# Patient Record
Sex: Female | Born: 1980 | Hispanic: No | Marital: Single | Smoking: Never smoker
Health system: Southern US, Community
[De-identification: ages and names within clinical notes are randomized; demographics above are authoritative.]

## PROBLEM LIST (undated history)

## (undated) DIAGNOSIS — F419 Anxiety disorder, unspecified: Secondary | ICD-10-CM

## (undated) DIAGNOSIS — N200 Calculus of kidney: Secondary | ICD-10-CM

## (undated) DIAGNOSIS — E7800 Pure hypercholesterolemia, unspecified: Secondary | ICD-10-CM

## (undated) DIAGNOSIS — R87619 Unspecified abnormal cytological findings in specimens from cervix uteri: Secondary | ICD-10-CM

## (undated) HISTORY — PX: OOPHORECTOMY: SHX86

## (undated) HISTORY — PX: COLPOSCOPY: SHX161

## (undated) HISTORY — DX: Unspecified abnormal cytological findings in specimens from cervix uteri: R87.619

## (undated) HISTORY — DX: Calculus of kidney: N20.0

## (undated) HISTORY — PX: OVARY REMOVAL: SHX86

## (undated) HISTORY — DX: Anxiety disorder, unspecified: F41.9

---

## 2001-12-08 ENCOUNTER — Other Ambulatory Visit: Admission: RE | Admit: 2001-12-08 | Discharge: 2001-12-08 | Payer: Self-pay | Admitting: *Deleted

## 2003-06-16 ENCOUNTER — Other Ambulatory Visit: Admission: RE | Admit: 2003-06-16 | Discharge: 2003-06-16 | Payer: Self-pay | Admitting: Gynecology

## 2005-03-13 ENCOUNTER — Other Ambulatory Visit: Admission: RE | Admit: 2005-03-13 | Discharge: 2005-03-13 | Payer: Self-pay | Admitting: Obstetrics and Gynecology

## 2006-06-05 ENCOUNTER — Other Ambulatory Visit: Admission: RE | Admit: 2006-06-05 | Discharge: 2006-06-05 | Payer: Self-pay | Admitting: Obstetrics and Gynecology

## 2006-10-13 ENCOUNTER — Other Ambulatory Visit: Admission: RE | Admit: 2006-10-13 | Discharge: 2006-10-13 | Payer: Self-pay | Admitting: Obstetrics and Gynecology

## 2007-02-17 ENCOUNTER — Other Ambulatory Visit: Admission: RE | Admit: 2007-02-17 | Discharge: 2007-02-17 | Payer: Self-pay | Admitting: Obstetrics and Gynecology

## 2007-07-06 ENCOUNTER — Other Ambulatory Visit: Admission: RE | Admit: 2007-07-06 | Discharge: 2007-07-06 | Payer: Self-pay | Admitting: Obstetrics and Gynecology

## 2007-12-21 ENCOUNTER — Other Ambulatory Visit: Admission: RE | Admit: 2007-12-21 | Discharge: 2007-12-21 | Payer: Self-pay | Admitting: Obstetrics and Gynecology

## 2012-03-24 ENCOUNTER — Ambulatory Visit (INDEPENDENT_AMBULATORY_CARE_PROVIDER_SITE_OTHER): Payer: BC Managed Care – PPO | Admitting: Licensed Clinical Social Worker

## 2012-03-24 DIAGNOSIS — F411 Generalized anxiety disorder: Secondary | ICD-10-CM

## 2012-03-25 ENCOUNTER — Telehealth: Payer: Self-pay | Admitting: Gynecology

## 2012-03-26 ENCOUNTER — Telehealth: Payer: Self-pay | Admitting: Certified Nurse Midwife

## 2012-03-29 ENCOUNTER — Ambulatory Visit (INDEPENDENT_AMBULATORY_CARE_PROVIDER_SITE_OTHER): Payer: BC Managed Care – PPO | Admitting: Certified Nurse Midwife

## 2012-03-29 ENCOUNTER — Other Ambulatory Visit: Payer: Self-pay | Admitting: *Deleted

## 2012-03-29 ENCOUNTER — Encounter: Payer: Self-pay | Admitting: Certified Nurse Midwife

## 2012-03-29 VITALS — BP 104/64

## 2012-03-29 DIAGNOSIS — F411 Generalized anxiety disorder: Secondary | ICD-10-CM

## 2012-03-29 DIAGNOSIS — N852 Hypertrophy of uterus: Secondary | ICD-10-CM

## 2012-03-29 MED ORDER — ESCITALOPRAM OXALATE 10 MG PO TABS: 10.0000 mg | ORAL_TABLET | Freq: Every day | ORAL | Status: DC

## 2012-03-29 NOTE — Patient Instructions (Addendum)

## 2012-03-29 NOTE — Telephone Encounter (Signed)
Left message with J.Whitt to call on 03/25/12

## 2012-03-29 NOTE — Telephone Encounter (Signed)
Spoke with Lauren Moran this am @ 8:30 am  Patient had visit with her and [name] feels as I did medication a good option for her. Patient needs appointment with me to start medication. DLeonard CNM

## 2012-03-29 NOTE — Progress Notes (Addendum)
32 yo swf gopo here to discuss medication use for anxiety as discussed at annual exam on 03-15-12.  Continues to have periods of panic attacks with any social situation. 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  Family history of anxiety/depression with mother and sister. No thoughts of self harm or others.  So tired of feeling afraid and anxious.  Patient seen by [name] counselor as suggested at annual exam on 03-25-12.  Patient had not realized she was  Self medicating  with wine every night to get through the evening and the next day.  Felt the counseling evaluation was great.  Has another appointment on 04-08-12.   Patient has read on medication use, understands risks, benefits profile and would like to try.  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  Discussed risk of alcohol use with prescription drugs.  If thought of self harm or others seek emergency help or 911.  Patient agreeable RX Lexapro 10 mg po every am  #30 no refills    Continue counseling as scheduled 2-Keep evaluation appointment 3-Keep follow up appointment   RV 1 1/2 week, prn  35  minutes spent with patient with >50% of time spent in face to face counseling. Reviewed, TL

## 2012-03-31 ENCOUNTER — Ambulatory Visit (INDEPENDENT_AMBULATORY_CARE_PROVIDER_SITE_OTHER): Payer: BC Managed Care – PPO

## 2012-03-31 ENCOUNTER — Ambulatory Visit (INDEPENDENT_AMBULATORY_CARE_PROVIDER_SITE_OTHER): Payer: BC Managed Care – PPO | Admitting: Obstetrics and Gynecology

## 2012-03-31 ENCOUNTER — Other Ambulatory Visit: Payer: Self-pay | Admitting: Obstetrics and Gynecology

## 2012-03-31 ENCOUNTER — Other Ambulatory Visit: Payer: Self-pay | Admitting: Certified Nurse Midwife

## 2012-03-31 DIAGNOSIS — R1900 Intra-abdominal and pelvic swelling, mass and lump, unspecified site: Secondary | ICD-10-CM

## 2012-03-31 DIAGNOSIS — N852 Hypertrophy of uterus: Secondary | ICD-10-CM

## 2012-03-31 DIAGNOSIS — R1903 Right lower quadrant abdominal swelling, mass and lump: Secondary | ICD-10-CM

## 2012-03-31 DIAGNOSIS — F411 Generalized anxiety disorder: Secondary | ICD-10-CM | POA: Insufficient documentation

## 2012-03-31 NOTE — Patient Instructions (Signed)
Call back with date you would like to request for surgery.

## 2012-03-31 NOTE — Telephone Encounter (Signed)
Patient was given rx for trisprintec at annual exam for 1 yr on 03/15/12

## 2012-03-31 NOTE — Progress Notes (Signed)
32 yo single white female G0P0 who was seen for anex 03/15/2012 by Leota Sauers and was noted to have an enlarged uterus.  Pt presents today for PUS.  PUS shows a normal sized uterus 7 x 4 x 3 cm and a normal left ovary.  The right ovary is enlarged  With a 9 x 7 x 5 cm mass  with thick septations and some solid areas.  No suspicious doppler flow.  No free fluid.  Ipsilateral kidney seen and is w/o hydronephrosis.    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  These findings were discussed with the patient.  I recommend surgical removal of the mass, probably with USO however if I see there is viable ovary on that side, I will do a cystectomy.  Plan would be for laparoscopic RSO.  Procedure discussed with patient including outpt basis, and estimated time out of work of two weeks.  Pt requests surgery the end of April to allow for some issues at work to be resolved.  We will precede with scheduling, and then she will need a pre-op consult soon before surgery.   Her questions were invited and answered.

## 2012-04-07 ENCOUNTER — Ambulatory Visit (INDEPENDENT_AMBULATORY_CARE_PROVIDER_SITE_OTHER): Payer: BC Managed Care – PPO | Admitting: Licensed Clinical Social Worker

## 2012-04-07 DIAGNOSIS — F411 Generalized anxiety disorder: Secondary | ICD-10-CM

## 2012-04-09 ENCOUNTER — Ambulatory Visit (INDEPENDENT_AMBULATORY_CARE_PROVIDER_SITE_OTHER): Payer: BC Managed Care – PPO | Admitting: Certified Nurse Midwife

## 2012-04-09 ENCOUNTER — Encounter: Payer: Self-pay | Admitting: Certified Nurse Midwife

## 2012-04-09 VITALS — BP 100/64

## 2012-04-09 DIAGNOSIS — F411 Generalized anxiety disorder: Secondary | ICD-10-CM

## 2012-04-09 NOTE — Progress Notes (Signed)
31 y.o.Single Caucasian femaleG0P0000 here for follow-up of anxiety disorder being treated with  Lexapro   Initiated March 25,2014.  Patient taking medication as instructed in am.  Denies nausea, headache or other medication side effects. Reports no crying, one panic attacks no insomnia,  no fatigue, no thoughts of self harm or others.  Feelings of anxiety decreased somewhat.  Seeing [name]   Counselor weekly  Next visit April 28, 2012. Feels less anxious in general.  Has decreased alcohol use to only 2 drinks since starting Lexapro.  Diagnosed with ovarian cyst with surgery scheduled, not anxious about the surgery. Resting well when not at work, not worrying about the next day. Desires continuation.  O:Healthy WD,WN female, appropriately dressed    : Affect : Appropriate smiling A:1-Anxiety responding to Lexapro  2-Counseling in progress  3-Recent diagnosis of ovarian cyst with surgery scheduled  P:1- Continue medication as prescribed 2-Has Rx 3-RV 2 weeks  4-Instructed if thoughts of self harm or others seek immediate help 911 or emergency room.  Questions addressed.     35 minutes spent with patient with >50% of time spent in face to face counseling.                 Reviewed, TL

## 2012-04-12 ENCOUNTER — Encounter (HOSPITAL_COMMUNITY): Payer: Self-pay

## 2012-04-15 ENCOUNTER — Encounter (HOSPITAL_COMMUNITY): Payer: Self-pay

## 2012-04-15 ENCOUNTER — Encounter (HOSPITAL_COMMUNITY)
Admission: RE | Admit: 2012-04-15 | Discharge: 2012-04-15 | Disposition: A | Discharge status: Home / Self Care | Payer: BC Managed Care – PPO | Source: Ambulatory Visit | Attending: Obstetrics and Gynecology | Admitting: Obstetrics and Gynecology

## 2012-04-15 HISTORY — DX: Anxiety disorder, unspecified: F41.9

## 2012-04-15 HISTORY — DX: Pure hypercholesterolemia, unspecified: E78.00

## 2012-04-15 LAB — CBC
HCT: 39 % (ref 36.0–46.0)
MCH: 30.4 pg (ref 26.0–34.0)
MCHC: 33.3 g/dL (ref 30.0–36.0)
MCV: 91.1 fL (ref 78.0–100.0)
Platelets: 344 10*3/uL (ref 150–400)
RDW: 12.3 % (ref 11.5–15.5)

## 2012-04-15 NOTE — Patient Instructions (Addendum)
   Your procedure is scheduled RU:[id number] April 15  Enter through the Main Entrance of [facility] at:9:30am Pick up the phone at the desk and dial [phone] and inform us of your arrival.  Please call this number if you have any problems the morning of surgery: [phone]  Remember: Do not eat food or drink anything after midnight on Monday  Please take your Lexapro morning of surgery with sips of water  Do not wear jewelry, make-up, or FINGER nail polish No metal in your hair or on your body. Do not wear lotions, powders, perfumes. You may wear deodorant.  Please use your CHG wash as directed prior to surgery.  Do not shave anywhere for at least 12 hours prior to first CHG shower.  Do not bring valuables to the hospital. Contacts, dentures or bridgework may not be worn into surgery.  Leave suitcase in the car. After Surgery it may be brought to your room. For patients being admitted to the hospital, checkout time is 11:00am the day of discharge.  Patients discharged on the day of surgery will not be allowed to drive home.

## 2012-04-19 NOTE — H&P (Signed)
32 y.o.  Single  white female   G0P0000 here for laparoscopic RSO for ovarian cyst.  Pt was noted to have a pelvic mass on routine exam in March, and had a PUS which revealed a nl uterus and left ovary, with a 9 cm solid/cystic mass on the right ovary, most c/w dermoid or cystadenoma.      Patient's last menstrual period was 03/23/2012.          Sexually active: yes  The current method of family planning is OCP (estrogen/progesterone).    Exercising:  Last mammogram:   Last pap smear: History of abnormal pap:  Smoking: Alcohol: Last colonoscopy: Last Bone Density:   Last tetanus shot: Last cholesterol check:   Hgb:                Urine:    Health Maintenance  Topic Date Due  . Pap Smear  08/14/1999  . Tetanus/tdap  08/13/2000  . Influenza Vaccine  09/06/2012    Family History  Problem Relation Age of Onset  . Anxiety disorder Mother   . Anxiety disorder Sister     Patient Active Problem List  Diagnosis  . Anxiety state, unspecified    Past Medical History  Diagnosis Date  . Anxiety   . Hypercholesteremia     Past Surgical History  Procedure Laterality Date  . No past surgeries      Allergies: Review of patient's allergies indicates no known allergies.  No current facility-administered medications for this encounter.   Current Outpatient Prescriptions  Medication Sig Dispense Refill  . escitalopram (LEXAPRO) 10 MG tablet Take 10 mg by mouth daily.      Lorita Officer Triphasic (TRI-SPRINTEC PO) Take 1 tablet by mouth daily.         ROS: Pertinent items are noted in HPI.  Social Hx:  Single, no children  Exam:    Ht 5' 5.5" (1.664 m)  Wt 140 lb (63.504 kg)  BMI 22.93 kg/m2  LMP 03/23/2012   Wt Readings from Last 3 Encounters:  04/15/12 137 lb (62.143 kg)  04/14/12 140 lb (63.504 kg)     Ht Readings from Last 3 Encounters:  04/15/12 5' 5.5" (1.664 m)  04/14/12 5' 5.5" (1.664 m)    General appearance: alert, cooperative and appears  stated age Head: Normocephalic, without obvious abnormality, atraumatic Neck: no adenopathy, supple, symmetrical, trachea midline and thyroid not enlarged, symmetric, no tenderness/mass/nodules Lungs: clear to auscultation bilaterally Breasts: Inspection negative, No nipple retraction or dimpling, No nipple discharge or bleeding, No axillary or supraclavicular adenopathy, Normal to palpation without dominant masses Heart: regular rate and rhythm Abdomen: soft, non-tender; bowel sounds normal; no masses,  no organomegaly Extremities: extremities normal, atraumatic, no cyanosis or edema Skin: Skin color, texture, turgor normal. No rashes or lesions Lymph nodes: Cervical, supraclavicular, and axillary nodes normal. No abnormal inguinal nodes palpated Neurologic: Grossly normal   Pelvic: External genitalia:  no lesions              Urethra:  normal appearing urethra with no masses, tenderness or lesions              Bartholins and Skenes: normal                 Vagina: normal appearing vagina with normal color and discharge, no lesions              Cervix: normal appearance  Bimanual Exam:  Uterus:  uterus is normal size, shape, consistency and nontender                                      Adnexa: left nl.  Right with 9 cm mass, nt                                      Anus:  normal sphincter tone, no lesions  A: right ovarian mass, prob dermoid vs cystadenoma     P: Laparoscopic RSO

## 2012-04-20 ENCOUNTER — Ambulatory Visit (HOSPITAL_COMMUNITY): Payer: BC Managed Care – PPO | Admitting: Anesthesiology

## 2012-04-20 ENCOUNTER — Encounter (HOSPITAL_COMMUNITY)
Admission: RE | Disposition: A | Discharge status: Home / Self Care | Payer: Self-pay | Source: Ambulatory Visit | Attending: Obstetrics and Gynecology

## 2012-04-20 ENCOUNTER — Encounter (HOSPITAL_COMMUNITY): Payer: Self-pay | Admitting: Anesthesiology

## 2012-04-20 ENCOUNTER — Encounter: Payer: Self-pay | Admitting: Obstetrics and Gynecology

## 2012-04-20 ENCOUNTER — Ambulatory Visit (INDEPENDENT_AMBULATORY_CARE_PROVIDER_SITE_OTHER): Payer: BC Managed Care – PPO | Admitting: Obstetrics and Gynecology

## 2012-04-20 ENCOUNTER — Ambulatory Visit (HOSPITAL_COMMUNITY)
Admission: RE | Admit: 2012-04-20 | Discharge: 2012-04-20 | Disposition: A | Discharge status: Home / Self Care | Payer: BC Managed Care – PPO | Source: Ambulatory Visit | Attending: Obstetrics and Gynecology | Admitting: Obstetrics and Gynecology

## 2012-04-20 VITALS — BP 118/76 | Wt 139.0 lb

## 2012-04-20 DIAGNOSIS — N83209 Unspecified ovarian cyst, unspecified side: Secondary | ICD-10-CM

## 2012-04-20 DIAGNOSIS — N839 Noninflammatory disorder of ovary, fallopian tube and broad ligament, unspecified: Secondary | ICD-10-CM | POA: Insufficient documentation

## 2012-04-20 DIAGNOSIS — D279 Benign neoplasm of unspecified ovary: Secondary | ICD-10-CM | POA: Insufficient documentation

## 2012-04-20 DIAGNOSIS — N83201 Unspecified ovarian cyst, right side: Secondary | ICD-10-CM

## 2012-04-20 HISTORY — PX: LAPAROSCOPY: SHX197

## 2012-04-20 HISTORY — PX: SALPINGOOPHORECTOMY: SHX82

## 2012-04-20 SURGERY — LAPAROSCOPY OPERATIVE
Anesthesia: General | Site: Abdomen | Laterality: Right | Wound class: Clean Contaminated

## 2012-04-20 MED ORDER — PROPOFOL 10 MG/ML IV EMUL
INTRAVENOUS | Status: AC
  Filled 2012-04-20: qty 20

## 2012-04-20 MED ORDER — SODIUM CHLORIDE 0.9 % IJ SOLN: 3.0000 mL | Freq: Two times a day (BID) | INTRAMUSCULAR | Status: DC

## 2012-04-20 MED ORDER — SODIUM CHLORIDE 0.9 % IV SOLN: 250.0000 mL | INTRAVENOUS | Status: DC | PRN

## 2012-04-20 MED ORDER — SCOPOLAMINE 1 MG/3DAYS TD PT72
1.0000 | MEDICATED_PATCH | TRANSDERMAL | Status: DC
  Administered 2012-04-20: 1.5 mg via TRANSDERMAL

## 2012-04-20 MED ORDER — ONDANSETRON HCL 4 MG/2ML IJ SOLN: 4.0000 mg | Freq: Four times a day (QID) | INTRAMUSCULAR | Status: DC | PRN

## 2012-04-20 MED ORDER — FENTANYL CITRATE 0.05 MG/ML IJ SOLN
INTRAMUSCULAR | Status: DC | PRN
  Administered 2012-04-20: 100 ug via INTRAVENOUS
  Administered 2012-04-20: 50 ug via INTRAVENOUS
  Administered 2012-04-20 (×2): 100 ug via INTRAVENOUS

## 2012-04-20 MED ORDER — OXYCODONE HCL 5 MG PO TABS: 5.0000 mg | ORAL_TABLET | ORAL | Status: DC | PRN

## 2012-04-20 MED ORDER — MIDAZOLAM HCL 2 MG/2ML IJ SOLN
INTRAMUSCULAR | Status: AC
Start: 2012-04-20 — End: 2012-04-20
  Filled 2012-04-20: qty 2

## 2012-04-20 MED ORDER — ROCURONIUM BROMIDE 100 MG/10ML IV SOLN
INTRAVENOUS | Status: DC | PRN
  Administered 2012-04-20: 30 mg via INTRAVENOUS

## 2012-04-20 MED ORDER — FENTANYL CITRATE 0.05 MG/ML IJ SOLN
INTRAMUSCULAR | Status: AC
  Filled 2012-04-20: qty 2

## 2012-04-20 MED ORDER — SCOPOLAMINE 1 MG/3DAYS TD PT72
MEDICATED_PATCH | TRANSDERMAL | Status: AC
  Filled 2012-04-20: qty 1

## 2012-04-20 MED ORDER — LIDOCAINE HCL (CARDIAC) 20 MG/ML IV SOLN
INTRAVENOUS | Status: DC | PRN
  Administered 2012-04-20: 40 mg via INTRAVENOUS

## 2012-04-20 MED ORDER — ROCURONIUM BROMIDE 50 MG/5ML IV SOLN
INTRAVENOUS | Status: AC
  Filled 2012-04-20: qty 1

## 2012-04-20 MED ORDER — NEOSTIGMINE METHYLSULFATE 1 MG/ML IJ SOLN
INTRAMUSCULAR | Status: DC | PRN
  Administered 2012-04-20: 2 mg via INTRAVENOUS

## 2012-04-20 MED ORDER — SODIUM CHLORIDE 0.9 % IJ SOLN
INTRAMUSCULAR | Status: DC | PRN
  Administered 2012-04-20: 3 mL

## 2012-04-20 MED ORDER — BUPIVACAINE HCL (PF) 0.25 % IJ SOLN
INTRAMUSCULAR | Status: DC | PRN
  Administered 2012-04-20: 14 mL

## 2012-04-20 MED ORDER — LACTATED RINGERS IV SOLN
INTRAVENOUS | Status: DC
  Administered 2012-04-20 (×2): via INTRAVENOUS

## 2012-04-20 MED ORDER — DEXAMETHASONE SODIUM PHOSPHATE 10 MG/ML IJ SOLN
INTRAMUSCULAR | Status: AC
Start: 2012-04-20 — End: 2012-04-20
  Filled 2012-04-20: qty 1

## 2012-04-20 MED ORDER — METOCLOPRAMIDE HCL 5 MG/ML IJ SOLN: 10.0000 mg | Freq: Once | INTRAMUSCULAR | Status: DC | PRN

## 2012-04-20 MED ORDER — GLYCOPYRROLATE 0.2 MG/ML IJ SOLN
INTRAMUSCULAR | Status: DC | PRN
  Administered 2012-04-20: 0.4 mg via INTRAVENOUS

## 2012-04-20 MED ORDER — FENTANYL CITRATE 0.05 MG/ML IJ SOLN
25.0000 ug | INTRAMUSCULAR | Status: DC | PRN
  Administered 2012-04-20: 50 ug via INTRAVENOUS

## 2012-04-20 MED ORDER — MIDAZOLAM HCL 5 MG/5ML IJ SOLN
INTRAMUSCULAR | Status: DC | PRN
  Administered 2012-04-20: 2 mg via INTRAVENOUS

## 2012-04-20 MED ORDER — PROPOFOL 10 MG/ML IV BOLUS
INTRAVENOUS | Status: DC | PRN
  Administered 2012-04-20: 150 mg via INTRAVENOUS

## 2012-04-20 MED ORDER — KETOROLAC TROMETHAMINE 30 MG/ML IJ SOLN
INTRAMUSCULAR | Status: DC | PRN
  Administered 2012-04-20: 30 mg via INTRAVENOUS

## 2012-04-20 MED ORDER — GLYCOPYRROLATE 0.2 MG/ML IJ SOLN
INTRAMUSCULAR | Status: AC
  Filled 2012-04-20: qty 2

## 2012-04-20 MED ORDER — DEXAMETHASONE SODIUM PHOSPHATE 10 MG/ML IJ SOLN
INTRAMUSCULAR | Status: DC | PRN
  Administered 2012-04-20: 10 mg via INTRAVENOUS

## 2012-04-20 MED ORDER — FENTANYL CITRATE 0.05 MG/ML IJ SOLN
INTRAMUSCULAR | Status: AC
  Filled 2012-04-20: qty 5

## 2012-04-20 MED ORDER — ONDANSETRON HCL 4 MG/2ML IJ SOLN
INTRAMUSCULAR | Status: DC | PRN
  Administered 2012-04-20: 4 mg via INTRAVENOUS

## 2012-04-20 MED ORDER — BUPIVACAINE HCL (PF) 0.25 % IJ SOLN
INTRAMUSCULAR | Status: AC
Start: 2012-04-20 — End: 2012-04-20
  Filled 2012-04-20: qty 30

## 2012-04-20 MED ORDER — LIDOCAINE HCL (CARDIAC) 20 MG/ML IV SOLN
INTRAVENOUS | Status: AC
  Filled 2012-04-20: qty 5

## 2012-04-20 MED ORDER — MORPHINE SULFATE 4 MG/ML IJ SOLN: 2.0000 mg | INTRAMUSCULAR | Status: DC | PRN

## 2012-04-20 MED ORDER — LACTATED RINGERS IR SOLN
Status: DC | PRN
  Administered 2012-04-20: 3000 mL

## 2012-04-20 MED ORDER — ONDANSETRON HCL 4 MG/2ML IJ SOLN
INTRAMUSCULAR | Status: AC
Start: 2012-04-20 — End: 2012-04-20
  Filled 2012-04-20: qty 2

## 2012-04-20 MED ORDER — NEOSTIGMINE METHYLSULFATE 1 MG/ML IJ SOLN
INTRAMUSCULAR | Status: AC
  Filled 2012-04-20: qty 1

## 2012-04-20 MED ORDER — MEPERIDINE HCL 25 MG/ML IJ SOLN: 6.2500 mg | INTRAMUSCULAR | Status: DC | PRN

## 2012-04-20 SURGICAL SUPPLY — 34 items
ADH SKN CLS APL DERMABOND .7 (GAUZE/BANDAGES/DRESSINGS) ×2
BAG SPEC RTRVL LRG 6X4 10 (ENDOMECHANICALS)
BARRIER ADHS 3X4 INTERCEED (GAUZE/BANDAGES/DRESSINGS) IMPLANT
BRR ADH 4X3 ABS CNTRL BYND (GAUZE/BANDAGES/DRESSINGS)
CABLE HIGH FREQUENCY MONO STRZ (ELECTRODE) IMPLANT
DERMABOND ADVANCED (GAUZE/BANDAGES/DRESSINGS) ×1
DERMABOND ADVANCED .7 DNX12 (GAUZE/BANDAGES/DRESSINGS) ×2 IMPLANT
DILATOR CANAL MILEX (MISCELLANEOUS) IMPLANT
FORCEPS CUTTING 33CM 5MM (CUTTING FORCEPS) ×4 IMPLANT
GLOVE BIOGEL PI IND STRL 7.0 (GLOVE) ×4 IMPLANT
GLOVE BIOGEL PI INDICATOR 7.0 (GLOVE) ×2
GLOVE ECLIPSE 6.5 STRL STRAW (GLOVE) ×6 IMPLANT
GOWN PREVENTION PLUS LG XLONG (DISPOSABLE) ×9 IMPLANT
NS IRRIG 1000ML POUR BTL (IV SOLUTION) ×3 IMPLANT
PACK LAPAROSCOPY BASIN (CUSTOM PROCEDURE TRAY) ×3 IMPLANT
POUCH SPECIMEN RETRIEVAL 10MM (ENDOMECHANICALS) IMPLANT
PROTECTOR NERVE ULNAR (MISCELLANEOUS) ×3 IMPLANT
SCISSORS LAP 5X35 DISP (ENDOMECHANICALS) IMPLANT
SEALER TISSUE G2 CVD JAW 35 (ENDOMECHANICALS) IMPLANT
SEALER TISSUE G2 CVD JAW 45CM (ENDOMECHANICALS)
SET IRRIG TUBING LAPAROSCOPIC (IRRIGATION / IRRIGATOR) ×1 IMPLANT
SUT VIC AB 3-0 PS2 18 (SUTURE) ×3
SUT VIC AB 3-0 PS2 18XBRD (SUTURE) ×2 IMPLANT
SUT VICRYL 0 ENDOLOOP (SUTURE) IMPLANT
SUT VICRYL 0 UR6 27IN ABS (SUTURE) ×5 IMPLANT
SYR 50ML LL SCALE MARK (SYRINGE) IMPLANT
SYR 5ML LL (SYRINGE) ×3 IMPLANT
TOWEL OR 17X24 6PK STRL BLUE (TOWEL DISPOSABLE) ×6 IMPLANT
TRAY FOLEY CATH 14FR (SET/KITS/TRAYS/PACK) ×3 IMPLANT
TROCAR BALLN 12MMX100 BLUNT (TROCAR) IMPLANT
TROCAR XCEL NON-BLD 11X100MML (ENDOMECHANICALS) ×2 IMPLANT
TROCAR XCEL NON-BLD 5MMX100MML (ENDOMECHANICALS) ×2 IMPLANT
WARMER LAPAROSCOPE (MISCELLANEOUS) ×3 IMPLANT
WATER STERILE IRR 1000ML POUR (IV SOLUTION) ×3 IMPLANT

## 2012-04-20 NOTE — Progress Notes (Signed)
33 yo SWF G0P0 here for pre op laparoscopic RSO due to a 9 cm cystic mass on the right ovary with septations and some solid areas, consistant with dermoid vs cystadenoma.  PMH, ALL, MEDS, all reviewed.  Exam:  HEENT wnl     Lungs clear     Heart RRR     Abd soft nt, nl bs     Extrem:  Neg     Pelvic:  Ext, vag, cx nl         BM:  Uterus small and NT and mobile.  Left adnexa nl.  Right with known 9 cm soft mass.     Extrem:  Neg  Procedure of laparoscopic RSO discussed with patient.  Risks and possible complications of the procedure were discussed with the patient, including, but not limited to, bleeding; infection; anesthesia complications; injury to a vessel or visceral organ requiring further surgery, either immediately or in the future; neuropathy; post operative shoulder pain; wound complications including infection, hematoma, and bowel herniation; VTE; gas embolism; even death.    Patient's questions were invited and answered.  She states she understands and accepts the risks and possible complications, and wishes to proceed with surgery.    Rx given for percocet 5/325 mg #15, one or two po q4h prn post op pain.  Ca 125 to be drawn with pre op labs.  Ready for surgery.

## 2012-04-20 NOTE — Transfer of Care (Signed)
Immediate Anesthesia Transfer of Care Note  Patient: Lauren Moran  Procedure(s) Performed: Procedure(s): LAPAROSCOPY OPERATIVE (N/A) SALPINGO OOPHORECTOMY (Right)  Patient Location: PACU  Anesthesia Type:General  Level of Consciousness: awake  Airway & Oxygen Therapy: Patient Spontanous Breathing and Patient connected to nasal cannula oxygen  Post-op Assessment: Report given to PACU RN and Post -op Vital signs reviewed and stable  Post vital signs: stable  Complications: No apparent anesthesia complications

## 2012-04-20 NOTE — Anesthesia Preprocedure Evaluation (Addendum)
Anesthesia Evaluation  Patient identified by MRN, date of birth, ID band Patient awake    Reviewed: Allergy & Precautions, H&P , NPO status , Patient's Chart, lab work & pertinent test results  Airway Mallampati: II TM Distance: >3 FB Neck ROM: Full    Dental no notable dental hx. (+) Teeth Intact   Pulmonary neg pulmonary ROS,  breath sounds clear to auscultation  Pulmonary exam normal       Cardiovascular negative cardio ROS  Rhythm:Regular Rate:Normal     Neuro/Psych Anxiety negative neurological ROS     GI/Hepatic negative GI ROS, Neg liver ROS,   Endo/Other  negative endocrine ROS  Renal/GU negative Renal ROS  negative genitourinary   Musculoskeletal negative musculoskeletal ROS (+)   Abdominal   Peds  Hematology negative hematology ROS (+)   Anesthesia Other Findings   Reproductive/Obstetrics RLQ Mass                           Anesthesia Physical Anesthesia Plan  ASA: II  Anesthesia Plan: General   Post-op Pain Management:    Induction: Intravenous  Airway Management Planned: Oral ETT  Additional Equipment:   Intra-op Plan:   Post-operative Plan: Extubation in OR  Informed Consent: I have reviewed the patients History and Physical, chart, labs and discussed the procedure including the risks, benefits and alternatives for the proposed anesthesia with the patient or authorized representative who has indicated his/her understanding and acceptance.   Dental advisory given  Plan Discussed with: CRNA, Anesthesiologist and Surgeon  Anesthesia Plan Comments:         Anesthesia Quick Evaluation

## 2012-04-20 NOTE — Op Note (Signed)
Preoperative diagnosis: 9 cm right ovarian mass, appears benign Postoperative diagnosis: Same, path pending Procedure: Laparoscopic right salpingo-oophorectomy Surgeon: Dr. Meredeth Ide Assistant: Dr. Leda Quail Anesthesia: Gen. endotracheal Estimated blood loss: Minimal Complications: None Procedure: The patient was taken to the operating room and after the induction of adequate general endotracheal anesthesia was placed in the low dorsolithotomy position and prepped and draped in usual fashion.  A Graves speculum was inserted into the vagina and the cervix was visualized, and a Hulka uterine manipulator was placed.  A Foley catheter was placed.  Attention was next turned to the abdomen.  The skin in the infraumbilical fold was anesthetized with quarter percent Marcaine, incised, and a Vares needle was inserted into the peritoneal space.  Proper placement was tested using the hanging drop technique.  Pneumoperitoneum was then created with 2 and half liters of CO2.  A 10/11 mm Optiview trocar was then used to place the umbilical port.  Proper placement was noted using the laparoscope.  The abdominal wall was then transilluminated, sites were planned for the 25 mm ports in the right and left lower quadrants.  The skin was anesthetized with quarter percent Marcaine, incised, and the 5 mm trochars were inserted under direct visualization in each of the right and left lower quadrants.  The abdomen and pelvis was then inspected.  The upper abdomen appeared normal.  The liver was smooth the gallbladder was distended the stomach was normal.  In the pelvis, the uterus was small and freely mobile.  The left tube and ovary were normal.  The anterior posterior cul-de-sacs were normal.  The right ovary was distorted by the presence of a large smooth ovarian the cyst, dragging the ovary into the posterior cul-de-sac.  The cyst was freely mobile.  There were no surface excrescences.  No amount of residual normal  ovary could be identified.  It was therefore felt necessary to perform a salpingo-oophorectomy.  The right ureter was identified and seen peristalsing.  The PK gyrus was used to coagulate and cut the infundibulopelvic ligament, the utero-ovarian ligament and tube, and the mesosalpinx, thereby freeing up the mass.  The mass was felt to be too large to be contained within an Endopouch, therefore the decision was made to drain the cyst with needle aspiration.  A laparoscopic needle was inserted through the left lower quadrant port under direct visualization and the tip of the needle was inserted into the base of the cyst.  10 cc of thick yellow fluid was removed.  Because the fluid was thick, it was difficult aspirated through the needle.  Therefore a hook scissor was brought in and a small incision was made in the capsule of the mass, and the Nezhat suction was brought into the cyst and its contents aspirated.  This essentially collapsed the cyst making it possible to then wasted in the Endopouch.  The Endopouch was brought in through the 10 mm umbilical port while watching with a 5 mm camera from the right lower quadrant.  The mass was then removed through the umbilicus.  It was sent to pathology.  A 10 mm scope was then reinserted and the pelvis was copiously irrigated.  Photographic documentation was taken.  The pressure in the abdomen was allowed to be reduced to approximately 8 mm of mercury and the pedicles were inspected and felt to be hemostatic.  The 5 mm trochars were removed under direct visualization.  The pulmonary recruitment maneuver was used to assist The gas in the abdomen,  and then the 10 mm umbilical trocar was removed.  Fascia at the umbilicus was closed with a single suture of 0 Vicryl.  The skin was closed at the umbilicus with 4-0 Vicryl Rapide subcuticularly.  All the incisions were then closed with Dermabond.  Instruments removed and the vagina, and the Foley catheter was removed.  Sponge  needle and instrument counts were correct.  The patient was taken to the recovery room in satisfactory condition.

## 2012-04-21 ENCOUNTER — Institutional Professional Consult (permissible substitution): Payer: Self-pay | Admitting: Obstetrics and Gynecology

## 2012-04-21 ENCOUNTER — Encounter (HOSPITAL_COMMUNITY): Payer: Self-pay | Admitting: Obstetrics and Gynecology

## 2012-04-21 NOTE — Anesthesia Postprocedure Evaluation (Signed)
  Anesthesia Post-op Note  Patient: Lauren Moran  Procedure(s) Performed: Procedure(s): LAPAROSCOPY OPERATIVE (N/A) SALPINGO OOPHORECTOMY (Right)  Patient is awake and responsive. Pain and nausea are reasonably well controlled. Vital signs are stable and clinically acceptable. Oxygen saturation is clinically acceptable. There are no apparent anesthetic complications at this time. Patient is ready for discharge.

## 2012-04-28 ENCOUNTER — Ambulatory Visit (INDEPENDENT_AMBULATORY_CARE_PROVIDER_SITE_OTHER): Payer: BC Managed Care – PPO | Admitting: Licensed Clinical Social Worker

## 2012-04-28 DIAGNOSIS — F411 Generalized anxiety disorder: Secondary | ICD-10-CM

## 2012-04-29 ENCOUNTER — Telehealth: Payer: Self-pay | Admitting: Certified Nurse Midwife

## 2012-04-29 MED ORDER — ESCITALOPRAM OXALATE 10 MG PO TABS: 10.0000 mg | ORAL_TABLET | Freq: Every day | ORAL | Status: DC

## 2012-04-29 NOTE — Telephone Encounter (Signed)
Patient requesting refill on 10 mg. Lexapro/[facility]

## 2012-04-29 NOTE — Telephone Encounter (Signed)
Pt was given Rx and told to follow up with DL in 2 weeks no scheduled appointment for follow up but post op visit is 05/10/12. Please advise

## 2012-04-29 NOTE — Telephone Encounter (Signed)
RF for one month.  She can get RX at post op visit.

## 2012-05-05 ENCOUNTER — Ambulatory Visit: Payer: BC Managed Care – PPO | Admitting: Obstetrics and Gynecology

## 2012-05-10 ENCOUNTER — Ambulatory Visit (INDEPENDENT_AMBULATORY_CARE_PROVIDER_SITE_OTHER): Payer: BC Managed Care – PPO | Admitting: Obstetrics and Gynecology

## 2012-05-10 ENCOUNTER — Encounter: Payer: Self-pay | Admitting: Obstetrics and Gynecology

## 2012-05-10 VITALS — BP 112/70 | Wt 136.0 lb

## 2012-05-10 DIAGNOSIS — N83209 Unspecified ovarian cyst, unspecified side: Secondary | ICD-10-CM

## 2012-05-10 NOTE — Patient Instructions (Signed)
Return for routine gyn exams or sooner if needed.  It's been a pleasure to be your doctor!

## 2012-05-10 NOTE — Progress Notes (Signed)
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  Pt reports being very pleased with the recovery process.  She had essentially no pain.  Feels back to normal.  Nl GI/GU function.  Photos and path reviewed with pt.  Exam:  abd soft, flat, NT.  Incisions healing very well. Pelvic:  Uterus mobile and NT.  Rt adnexa NT and w/o mass.  Lft adnexa nl.  Nl post op check.    Routine care.

## 2012-05-12 ENCOUNTER — Ambulatory Visit: Payer: BC Managed Care – PPO | Admitting: Licensed Clinical Social Worker

## 2012-05-28 ENCOUNTER — Other Ambulatory Visit: Payer: Self-pay | Admitting: Obstetrics & Gynecology

## 2012-05-28 NOTE — Telephone Encounter (Signed)
Fine to refill until next AnEx.

## 2012-05-28 NOTE — Telephone Encounter (Signed)
Rx for Lexapro 10 mg 1 po qd #30/9 refills (until next aex due in 03/2013) sent to pt's pharmacy- per Dr. Tresa Res. Pt is aware (via VM).

## 2012-05-28 NOTE — Telephone Encounter (Signed)
Please advise- pt requesting refill on Lexapro 10 mg. Pt was iven 1 months worth (#30) on 04/29/2012 by [facility] to maintain coverage until post-op visit. Pt attended post-op visit on 05/10/2012 with you (CR). i don't see Lexapro refills; ok to refill or pt discontinued?  Chart on your desk.

## 2012-07-28 ENCOUNTER — Telehealth: Payer: Self-pay | Admitting: Certified Nurse Midwife

## 2012-07-28 NOTE — Telephone Encounter (Signed)
Patient needs to switch Lexapro . It is not helping her . Would like to take something else.  Xanex possibly .

## 2012-07-28 NOTE — Telephone Encounter (Signed)
Will need an ov please either with DL or me.

## 2012-07-28 NOTE — Telephone Encounter (Signed)
Patient AEX 03/15/2012 paper chart with D.Leonard. Office visit with D.Leonard 03/29/2012/ 04/09/2012 to discuss anxiety issues and medication. Office visit with Dr. Tresa Res on  04/20/2012 / 05/10/2012 for post op laparoscopic procedure.  Patient calling today of wanting to change Lexapro medication. Making her drowsy , no energy , exhaustion, takes Lexapro at [employer]. Patient stated she had read about Wellbutrin and heard good things about it. Please advise. Patient also calling today of need for refill of Harriet Masson for her panic attacks which have increased and she has been out of her Xannax. Pharmacy  [facility].

## 2012-07-28 NOTE — Telephone Encounter (Signed)
Patient notified of need to schedule appointment for change in medication and refill for xanax per Dr. Tresa Res. Patient request appointment with Ortencia Kick. Appointment given for 08/03/2012 @ 12:45pm with Ortencia Kick. Marland Kitchen

## 2012-08-02 ENCOUNTER — Ambulatory Visit (INDEPENDENT_AMBULATORY_CARE_PROVIDER_SITE_OTHER): Payer: BC Managed Care – PPO | Admitting: Licensed Clinical Social Worker

## 2012-08-02 DIAGNOSIS — F411 Generalized anxiety disorder: Secondary | ICD-10-CM

## 2012-08-03 ENCOUNTER — Ambulatory Visit (INDEPENDENT_AMBULATORY_CARE_PROVIDER_SITE_OTHER): Payer: BC Managed Care – PPO | Admitting: Certified Nurse Midwife

## 2012-08-03 VITALS — BP 108/68 | HR 64 | Resp 16 | Ht 65.5 in | Wt 141.0 lb

## 2012-08-03 DIAGNOSIS — F411 Generalized anxiety disorder: Secondary | ICD-10-CM

## 2012-08-03 MED ORDER — ESCITALOPRAM OXALATE 10 MG PO TABS: 20.0000 mg | ORAL_TABLET | Freq: Every day | ORAL | Status: DC

## 2012-08-03 NOTE — Progress Notes (Signed)
31 y.o.SingleCaucasianfemaleG0P0000here for follow-up of anxiety being treated with  Lexapro 10 mg   Initiated March 30, 2012.Marland Kitchen  Patient taking medication as instructed in  PM.  Denies nausea, headache or other medication side effects . Reports no crying,some panic attacks,some insomnia, slight fatigue, but working 60 hours per week, no thoughts of self harm or others . Feelings of anxiety are still there, but feel that the medication was helping for a while, but symptoms seem to be returning. Alcohol use same 2 glasses of wine socially. "It is like the medicine has helped, but not as much now". No change in diet or job responsibilities or social changes. No health changes.     Seeing [name],  Counselor every 2-3 weeks.  Next visit in two weeks.  "Feel like without the counseling in addition to the medication, I can't control the anxiety"." What else can we do". Patient was exercising, but has reduced amount of time for exercise due to work.  O:Healthy WD,WN female, appropriately dressed    Weight: 141, 2 lb. Gain since medication onset Affect : Anxious ,orientation x 3, well dressed   A:1-Anxiety responding to Lexapro, but dosage change warranted 2-Counseling in progress with next appointment scheduled  P:1- Discussed with patient increasing dosage to maintain help with continued progress. Discussed medication appropriate choice due to progress made. Discussed exercise effect on anxiety reduction, and need to adjust schedule to include this for all health benefits. Patient acknowledged she can do this.  Patient agreeable to dosage increase, and will report any side effects. Expectations discussed. Continue counseling as planned. 2-RX Lexapro 20 mg daily see order 3-RV  3 weeks 4-Instructed if thoughts of self harm or others seek immediate help 911 or emergency room.  Questions addressed.        35 minutes spent with patient with >50% of time spent in face to face counseling.

## 2012-08-04 ENCOUNTER — Encounter: Payer: Self-pay | Admitting: Certified Nurse Midwife

## 2012-08-05 NOTE — Progress Notes (Signed)
Note reviewed, agree with plan.  Safiyyah Vasconez, MD  

## 2012-08-18 ENCOUNTER — Ambulatory Visit (INDEPENDENT_AMBULATORY_CARE_PROVIDER_SITE_OTHER): Payer: BC Managed Care – PPO | Admitting: Licensed Clinical Social Worker

## 2012-08-18 DIAGNOSIS — F411 Generalized anxiety disorder: Secondary | ICD-10-CM

## 2012-08-24 ENCOUNTER — Ambulatory Visit (INDEPENDENT_AMBULATORY_CARE_PROVIDER_SITE_OTHER): Payer: BC Managed Care – PPO | Admitting: Certified Nurse Midwife

## 2012-08-24 VITALS — BP 102/62 | HR 64 | Resp 16 | Ht 65.5 in | Wt 139.0 lb

## 2012-08-24 DIAGNOSIS — F411 Generalized anxiety disorder: Secondary | ICD-10-CM

## 2012-08-24 LAB — TSH: TSH: 1.241 u[IU]/mL (ref 0.350–4.500)

## 2012-08-24 NOTE — Progress Notes (Signed)
32 y.o.Single for follow-up of anxiety disorder being treated with  Lexapro 20 mg   Initiated August 05, 2012.Marland Kitchen  Patient taking medication as instructed AM.  Denies nausea, headache or other medication side effects. Reports no crying, panic attacks or Insomnia.fatigue is still a problem, even though " I sleep well".  No thoughts of self harm or others. .  Feelings of anxiety are not as intense.         Seeing J.Whitt,  Counselor weekly.  Next visit August  20,2014.  No health issues today. Work is still same, no change.  O:Healthy WD,WN female, appropriately dressed    Weight:139 3 lb. Weight loss Affect : Appropriate, orientation x 3  A:1-Anxiety responding to increase dose of Lexapro 2-Counseling in progress   P:1- Continue medication as prescribed 2-RX has refill Lab: Vit D TSH 3-RV one month 4-Instructed if thoughts of self harm or others seek immediate help 911 or emergency room.  Questions addressed.     20 minutes spent with patient with >50% of time spent in face to face counseling.

## 2012-08-25 ENCOUNTER — Telehealth: Payer: Self-pay

## 2012-08-25 ENCOUNTER — Ambulatory Visit (INDEPENDENT_AMBULATORY_CARE_PROVIDER_SITE_OTHER): Payer: BC Managed Care – PPO | Admitting: Licensed Clinical Social Worker

## 2012-08-25 DIAGNOSIS — F411 Generalized anxiety disorder: Secondary | ICD-10-CM

## 2012-08-25 LAB — VITAMIN D 25 HYDROXY (VIT D DEFICIENCY, FRACTURES): Vit D, 25-Hydroxy: 42 ng/mL (ref 30–89)

## 2012-08-25 NOTE — Telephone Encounter (Signed)
Message copied by Eliezer Bottom on Wed Aug 25, 2012 11:38 AM ------      Message from: Verner Chol      Created: Wed Aug 25, 2012  5:38 AM       Notify Vitamin D protocol      TSH normal ------

## 2012-08-25 NOTE — Telephone Encounter (Signed)
lmtcb

## 2012-08-27 NOTE — Telephone Encounter (Signed)
Patient called back for results.

## 2012-08-27 NOTE — Progress Notes (Signed)
Note reviewed, agree with plan.  Jacque Garrels, MD  

## 2012-09-01 ENCOUNTER — Ambulatory Visit (INDEPENDENT_AMBULATORY_CARE_PROVIDER_SITE_OTHER): Payer: BC Managed Care – PPO | Admitting: Licensed Clinical Social Worker

## 2012-09-01 DIAGNOSIS — F411 Generalized anxiety disorder: Secondary | ICD-10-CM

## 2012-09-03 NOTE — Telephone Encounter (Signed)
Left message for call back.

## 2012-09-08 NOTE — Telephone Encounter (Signed)
Left message for call back.

## 2012-09-10 NOTE — Telephone Encounter (Signed)
Patient notified by jasmine

## 2012-09-15 ENCOUNTER — Ambulatory Visit (INDEPENDENT_AMBULATORY_CARE_PROVIDER_SITE_OTHER): Payer: BC Managed Care – PPO | Admitting: Licensed Clinical Social Worker

## 2012-09-15 DIAGNOSIS — F411 Generalized anxiety disorder: Secondary | ICD-10-CM

## 2012-09-26 ENCOUNTER — Other Ambulatory Visit: Payer: Self-pay | Admitting: Certified Nurse Midwife

## 2012-09-29 ENCOUNTER — Ambulatory Visit (INDEPENDENT_AMBULATORY_CARE_PROVIDER_SITE_OTHER): Payer: BC Managed Care – PPO | Admitting: Licensed Clinical Social Worker

## 2012-09-29 DIAGNOSIS — F411 Generalized anxiety disorder: Secondary | ICD-10-CM

## 2012-09-29 NOTE — Telephone Encounter (Signed)
Per Ms. Debbie okay to refill x1 patient needs OV #30/1 rf's sent LM on patient's VM regarding this.

## 2012-09-29 NOTE — Telephone Encounter (Signed)
Patient says her pharmacy sent a request for a refill and has not gotten a response.

## 2012-09-29 NOTE — Telephone Encounter (Signed)
Pt calling for refills for Lexapro [facility]

## 2012-09-29 NOTE — Telephone Encounter (Signed)
Routed to dl 

## 2012-09-29 NOTE — Telephone Encounter (Signed)
Ok to refill x 1 but needs OV to assess status as per note.

## 2012-09-29 NOTE — Telephone Encounter (Signed)
Please advise Lauren Moran patient was seen 08/24/12 for OV no rx was given.   Patient is requesting refill

## 2012-10-13 ENCOUNTER — Ambulatory Visit (INDEPENDENT_AMBULATORY_CARE_PROVIDER_SITE_OTHER): Payer: BC Managed Care – PPO | Admitting: Licensed Clinical Social Worker

## 2012-10-13 DIAGNOSIS — F411 Generalized anxiety disorder: Secondary | ICD-10-CM

## 2012-10-15 ENCOUNTER — Other Ambulatory Visit: Payer: Self-pay | Admitting: Certified Nurse Midwife

## 2012-10-15 ENCOUNTER — Telehealth: Payer: Self-pay | Admitting: Emergency Medicine

## 2012-10-15 NOTE — Telephone Encounter (Signed)
Lauren Moran,  Patient called and made follow up appointment.  She needs refill of Lexapro as soon as possible per patient.

## 2012-10-17 ENCOUNTER — Other Ambulatory Visit: Payer: Self-pay | Admitting: Certified Nurse Midwife

## 2012-10-17 MED ORDER — ESCITALOPRAM OXALATE 10 MG PO TABS: 20.0000 mg | ORAL_TABLET | Freq: Every day | ORAL | Status: DC

## 2012-10-17 NOTE — Telephone Encounter (Signed)
Refill sent to pharmacy.   

## 2012-10-18 NOTE — Telephone Encounter (Signed)
LM on pt's VM that refill was sent to her pharmacy.

## 2012-10-28 ENCOUNTER — Telehealth: Payer: Self-pay | Admitting: Certified Nurse Midwife

## 2012-10-28 ENCOUNTER — Ambulatory Visit: Payer: Self-pay | Admitting: Certified Nurse Midwife

## 2012-10-28 NOTE — Telephone Encounter (Signed)
Pt cancel appointment for today pt states she has got held up at work. Pt did not  Reschedule appointment.

## 2012-10-29 ENCOUNTER — Ambulatory Visit (INDEPENDENT_AMBULATORY_CARE_PROVIDER_SITE_OTHER): Payer: BC Managed Care – PPO | Admitting: Licensed Clinical Social Worker

## 2012-10-29 DIAGNOSIS — F411 Generalized anxiety disorder: Secondary | ICD-10-CM

## 2012-11-15 ENCOUNTER — Ambulatory Visit: Payer: BC Managed Care – PPO | Admitting: Licensed Clinical Social Worker

## 2012-11-17 ENCOUNTER — Ambulatory Visit (INDEPENDENT_AMBULATORY_CARE_PROVIDER_SITE_OTHER): Payer: BC Managed Care – PPO | Admitting: Licensed Clinical Social Worker

## 2012-11-17 DIAGNOSIS — F411 Generalized anxiety disorder: Secondary | ICD-10-CM

## 2012-11-29 ENCOUNTER — Ambulatory Visit (INDEPENDENT_AMBULATORY_CARE_PROVIDER_SITE_OTHER): Payer: BC Managed Care – PPO | Admitting: Licensed Clinical Social Worker

## 2012-11-29 DIAGNOSIS — F411 Generalized anxiety disorder: Secondary | ICD-10-CM

## 2012-12-10 ENCOUNTER — Other Ambulatory Visit: Payer: Self-pay | Admitting: Certified Nurse Midwife

## 2012-12-10 NOTE — Telephone Encounter (Signed)
Refill request for Lexapro last office visit 08/24/12 was to follow in one month. F/U appt scheduled for 10/28/12 Was cancelled. Note in epic that patient was seeing J.Whitt weekly. Please advise on refill request.

## 2012-12-13 NOTE — Telephone Encounter (Signed)
She needed follow up to see how the increase dose was working. Please try to schedule

## 2012-12-14 NOTE — Telephone Encounter (Signed)
LMTCB to schedule F/U appointment .

## 2012-12-15 ENCOUNTER — Ambulatory Visit: Payer: BC Managed Care – PPO | Admitting: Licensed Clinical Social Worker

## 2012-12-20 ENCOUNTER — Ambulatory Visit (INDEPENDENT_AMBULATORY_CARE_PROVIDER_SITE_OTHER): Payer: BC Managed Care – PPO | Admitting: Licensed Clinical Social Worker

## 2012-12-20 DIAGNOSIS — F411 Generalized anxiety disorder: Secondary | ICD-10-CM

## 2012-12-20 NOTE — Telephone Encounter (Signed)
Left Message To Call Back  

## 2013-01-14 ENCOUNTER — Ambulatory Visit: Payer: BC Managed Care – PPO | Admitting: Licensed Clinical Social Worker

## 2013-01-14 ENCOUNTER — Other Ambulatory Visit: Payer: Self-pay | Admitting: Certified Nurse Midwife

## 2013-01-14 NOTE — Telephone Encounter (Signed)
Patient needs ov to continue

## 2013-01-14 NOTE — Telephone Encounter (Signed)
Patient notified scheduled her for follow up 01/17/13 @ 11:15

## 2013-01-14 NOTE — Telephone Encounter (Addendum)
Last refilled: 12/10/12 #30/1 refills Patient cancelled OV for follow up of Lexapro 10/28/12  Please advise.

## 2013-01-17 ENCOUNTER — Telehealth: Payer: Self-pay | Admitting: Certified Nurse Midwife

## 2013-01-17 ENCOUNTER — Ambulatory Visit: Payer: BC Managed Care – PPO | Admitting: Certified Nurse Midwife

## 2013-01-17 NOTE — Telephone Encounter (Signed)
Patient called at 11:10 to cancel her appt at 11:15 said she was in a meeting and it was running over time. Rescheduled her for tomorrow at 12:45. Do you want to charge [facility] fee?

## 2013-01-17 NOTE — Telephone Encounter (Signed)
no

## 2013-01-18 ENCOUNTER — Encounter: Payer: Self-pay | Admitting: Certified Nurse Midwife

## 2013-01-18 ENCOUNTER — Ambulatory Visit (INDEPENDENT_AMBULATORY_CARE_PROVIDER_SITE_OTHER): Payer: BC Managed Care – PPO | Admitting: Certified Nurse Midwife

## 2013-01-18 VITALS — BP 104/70 | HR 68 | Resp 16 | Ht 65.5 in | Wt 152.0 lb

## 2013-01-18 DIAGNOSIS — F411 Generalized anxiety disorder: Secondary | ICD-10-CM

## 2013-01-18 MED ORDER — ESCITALOPRAM OXALATE 20 MG PO TABS: 20.0000 mg | ORAL_TABLET | Freq: Every day | ORAL | Status: DC

## 2013-01-18 NOTE — Progress Notes (Signed)
33 y.o.Single Caucasian female [id number] here for follow-up of anxiety disorder being treated with  Lexapro 20 mg.   Initiated 08/05/2012.  Patient taking medication as instructed in afternoon.  Denies nausea, headache or other medication side effects . Reports no crying, panic attacks, insomnia, and no thoughts of self harm or others . Feelings of anxiety "so much better on 20 mg dose." Patient feels that she is in control now in stressful/anxiety producing situations. Continues with counseling with J.Whitt and coping mechanism improvement. Desires continuation of medication.     O:Healthy WD,WN female, appropriately dressed    Weight:152  Affect : Appropriate    A:1-Anxiety responding to Lexapro 20 mg, stable now 2-Counseling in progress  P:1- Continue medication as prescribed 2-RXLexapro 20 mg see order 3-RV aex unless change in status 4-Instructed if thoughts of self harm or others seek immediate help 911 or emergency room.  Questions addressed.  32 minutes spent with patient with >50% of time spent in face to face counseling.

## 2013-01-21 NOTE — Progress Notes (Signed)
Reviewed personally.  M. Suzanne Theresa Wedel, MD.  

## 2013-03-03 ENCOUNTER — Other Ambulatory Visit: Payer: Self-pay | Admitting: Certified Nurse Midwife

## 2013-03-04 NOTE — Telephone Encounter (Signed)
Last AEX and refill 03/15/12 x 1 year No future appt scheduled.  Will refill for 1 month. - Pt will need AEX before any further refills.   Encounter closed.

## 2013-03-22 ENCOUNTER — Encounter (HOSPITAL_COMMUNITY): Payer: Self-pay | Admitting: Emergency Medicine

## 2013-03-22 ENCOUNTER — Emergency Department (HOSPITAL_COMMUNITY)
Admission: EM | Admit: 2013-03-22 | Discharge: 2013-03-23 | Disposition: A | Discharge status: Home / Self Care | Payer: BC Managed Care – PPO | Attending: Emergency Medicine | Admitting: Emergency Medicine

## 2013-03-22 DIAGNOSIS — N12 Tubulo-interstitial nephritis, not specified as acute or chronic: Secondary | ICD-10-CM | POA: Insufficient documentation

## 2013-03-22 DIAGNOSIS — M545 Low back pain, unspecified: Secondary | ICD-10-CM | POA: Insufficient documentation

## 2013-03-22 DIAGNOSIS — F1010 Alcohol abuse, uncomplicated: Secondary | ICD-10-CM | POA: Insufficient documentation

## 2013-03-22 DIAGNOSIS — R109 Unspecified abdominal pain: Secondary | ICD-10-CM

## 2013-03-22 DIAGNOSIS — E7800 Pure hypercholesterolemia, unspecified: Secondary | ICD-10-CM | POA: Insufficient documentation

## 2013-03-22 DIAGNOSIS — F411 Generalized anxiety disorder: Secondary | ICD-10-CM | POA: Insufficient documentation

## 2013-03-22 DIAGNOSIS — Z79899 Other long term (current) drug therapy: Secondary | ICD-10-CM | POA: Insufficient documentation

## 2013-03-22 LAB — CBC WITH DIFFERENTIAL/PLATELET
BASOS ABS: 0 10*3/uL (ref 0.0–0.1)
Basophils Relative: 0 % (ref 0–1)
EOS ABS: 0.1 10*3/uL (ref 0.0–0.7)
EOS PCT: 1 % (ref 0–5)
HCT: 37.8 % (ref 36.0–46.0)
Hemoglobin: 12.7 g/dL (ref 12.0–15.0)
LYMPHS ABS: 1.6 10*3/uL (ref 0.7–4.0)
LYMPHS PCT: 16 % (ref 12–46)
MCH: 30.7 pg (ref 26.0–34.0)
MCHC: 33.6 g/dL (ref 30.0–36.0)
MCV: 91.3 fL (ref 78.0–100.0)
Monocytes Absolute: 1.1 10*3/uL — ABNORMAL HIGH (ref 0.1–1.0)
Monocytes Relative: 11 % (ref 3–12)
NEUTROS PCT: 72 % (ref 43–77)
Neutro Abs: 7.1 10*3/uL (ref 1.7–7.7)
PLATELETS: 344 10*3/uL (ref 150–400)
RBC: 4.14 MIL/uL (ref 3.87–5.11)
RDW: 13 % (ref 11.5–15.5)
WBC: 9.9 10*3/uL (ref 4.0–10.5)

## 2013-03-22 LAB — COMPREHENSIVE METABOLIC PANEL
ALK PHOS: 48 U/L (ref 39–117)
ALT: 10 U/L (ref 0–35)
AST: 17 U/L (ref 0–37)
Albumin: 3.5 g/dL (ref 3.5–5.2)
BILIRUBIN TOTAL: 0.5 mg/dL (ref 0.3–1.2)
BUN: 14 mg/dL (ref 6–23)
CHLORIDE: 103 meq/L (ref 96–112)
CO2: 21 meq/L (ref 19–32)
CREATININE: 1.18 mg/dL — AB (ref 0.50–1.10)
Calcium: 9 mg/dL (ref 8.4–10.5)
GFR calc Af Amer: 70 mL/min — ABNORMAL LOW (ref >90)
GFR, EST NON AFRICAN AMERICAN: 61 mL/min — AB (ref >90)
GLUCOSE: 104 mg/dL — AB (ref 70–99)
POTASSIUM: 3.9 meq/L (ref 3.7–5.3)
Sodium: 141 mEq/L (ref 137–147)
Total Protein: 7 g/dL (ref 6.0–8.3)

## 2013-03-22 NOTE — ED Notes (Addendum)
Patient here with complaint of RLQ abdominal pain starting last night with pain radiating towards right flank. Walking and eating increases pain. Patient complaining RLQ fullness. No nausea/vomiting or bowel alterations reported.

## 2013-03-22 NOTE — ED Notes (Signed)
Nurse first explained delay , wait time and process to pt.

## 2013-03-23 ENCOUNTER — Emergency Department (HOSPITAL_COMMUNITY): Payer: BC Managed Care – PPO

## 2013-03-23 LAB — URINALYSIS, ROUTINE W REFLEX MICROSCOPIC
BILIRUBIN URINE: NEGATIVE
Glucose, UA: NEGATIVE mg/dL
KETONES UR: 15 mg/dL — AB
NITRITE: POSITIVE — AB
PH: 6 (ref 5.0–8.0)
Protein, ur: 100 mg/dL — AB
SPECIFIC GRAVITY, URINE: 1.019 (ref 1.005–1.030)
UROBILINOGEN UA: 0.2 mg/dL (ref 0.0–1.0)

## 2013-03-23 LAB — URINE MICROSCOPIC-ADD ON

## 2013-03-23 LAB — POC URINE PREG, ED: PREG TEST UR: NEGATIVE

## 2013-03-23 MED ORDER — MORPHINE SULFATE 4 MG/ML IJ SOLN
4.0000 mg | Freq: Once | INTRAMUSCULAR | Status: AC
  Administered 2013-03-23: 4 mg via INTRAVENOUS
  Filled 2013-03-23: qty 1

## 2013-03-23 MED ORDER — CIPROFLOXACIN HCL 500 MG PO TABS: 500.0000 mg | ORAL_TABLET | Freq: Two times a day (BID) | ORAL | Status: DC

## 2013-03-23 MED ORDER — DEXTROSE 5 % IV SOLN
1.0000 g | Freq: Once | INTRAVENOUS | Status: AC
  Administered 2013-03-23: 1 g via INTRAVENOUS
  Filled 2013-03-23: qty 10

## 2013-03-23 MED ORDER — SODIUM CHLORIDE 0.9 % IV BOLUS (SEPSIS)
1000.0000 mL | Freq: Once | INTRAVENOUS | Status: AC
  Administered 2013-03-23: 1000 mL via INTRAVENOUS

## 2013-03-23 MED ORDER — KETOROLAC TROMETHAMINE 30 MG/ML IJ SOLN
30.0000 mg | Freq: Once | INTRAMUSCULAR | Status: AC
  Administered 2013-03-23: 30 mg via INTRAVENOUS
  Filled 2013-03-23: qty 1

## 2013-03-23 MED ORDER — OXYCODONE-ACETAMINOPHEN 5-325 MG PO TABS: 1.0000 | ORAL_TABLET | ORAL | Status: DC | PRN

## 2013-03-23 MED ORDER — ONDANSETRON 4 MG PO TBDP: 4.0000 mg | ORAL_TABLET | Freq: Three times a day (TID) | ORAL | Status: DC | PRN

## 2013-03-23 NOTE — ED Provider Notes (Signed)
CSN: [id number]     Arrival date & time 03/22/13  2014 History   First MD Initiated Contact with Patient 03/23/13 0014     Chief Complaint  Patient presents with  . Abdominal Pain     (Consider location/radiation/quality/duration/timing/severity/associated sxs/prior Treatment) HPI Comments: Patient is a G42 33 year old female presented to the emergency department for acute onset right lower abdominal pain with radiation to right lower back. Patient's pain is colicky in nature. Patient states her pain is aggravated with movement and certain positions. She states lying still alleviates her pain. Patient denies any nausea, vomiting, diarrhea, vaginal bleeding or discharge, urinary symptoms. Patient's abdominal surgical history includes right ovary removal. Patient was sent over from [place] for evaluation of abdominal pain   Past Medical History  Diagnosis Date  . Anxiety   . Hypercholesteremia    Past Surgical History  Procedure Laterality Date  . Laparoscopy N/A 04/20/2012    Procedure: LAPAROSCOPY OPERATIVE;  Surgeon: Peri Maris, MD;  Location: [facility];  Service: Gynecology;  Laterality: N/A;  . Salpingoophorectomy Right 04/20/2012    Procedure: SALPINGO OOPHORECTOMY;  Surgeon: Peri Maris, MD;  Location: [facility];  Service: Gynecology;  Laterality: Right;  . Oophorectomy Right    Family History  Problem Relation Age of Onset  . Anxiety disorder Mother   . Anxiety disorder Sister    History  Substance Use Topics  . Smoking status: Never Smoker   . Smokeless tobacco: Not on file  . Alcohol Use: 5.0 oz/week    10 drink(s) per week     Comment: occassional wine   OB History   Grav Para Term Preterm Abortions TAB SAB Ect Mult Living   0 0 0 0 0 0 0 0 0 0      Review of Systems  Constitutional: Negative for fever and chills.  Gastrointestinal: Positive for abdominal pain. Negative for nausea and vomiting.  Musculoskeletal: Positive for back pain.  All other systems  reviewed and are negative.      Allergies  Review of patient's allergies indicates no known allergies.  Home Medications   Current Outpatient Rx  Name  Route  Sig  Dispense  Refill  . escitalopram (LEXAPRO) 20 MG tablet   Oral   Take 1 tablet (20 mg total) by mouth daily.   30 tablet   2   . Norgestim-Eth Estrad Triphasic (TRI-SPRINTEC PO)   Oral   Take 1 tablet by mouth daily.          . ciprofloxacin (CIPRO) 500 MG tablet   Oral   Take 1 tablet (500 mg total) by mouth every 12 (twelve) hours.   20 tablet   0   . ondansetron (ZOFRAN ODT) 4 MG disintegrating tablet   Oral   Take 1 tablet (4 mg total) by mouth every 8 (eight) hours as needed for nausea or vomiting.   10 tablet   0   . oxyCODONE-acetaminophen (PERCOCET) 5-325 MG per tablet   Oral   Take 1 tablet by mouth every 4 (four) hours as needed.   20 tablet   0    BP 110/70  Pulse 79  Temp(Src) 99.1 F (37.3 C) (Oral)  Resp 14  Wt 155 lb 5 oz (70.449 kg)  SpO2 98%  LMP 03/08/2013 Physical Exam  Nursing note and vitals reviewed. Constitutional: She is oriented to person, place, and time. She appears well-developed and well-nourished. No distress.  HENT:  Head: Normocephalic and atraumatic.  Right Ear:  External ear normal.  Left Ear: External ear normal.  Nose: Nose normal.  Eyes: Conjunctivae are normal.  Neck: Neck supple.  Cardiovascular: Normal rate, regular rhythm and normal heart sounds.   Pulmonary/Chest: Effort normal and breath sounds normal. No respiratory distress.  Abdominal: Soft. Bowel sounds are normal. She exhibits no distension. There is tenderness. There is no rigidity, no rebound, no guarding and no CVA tenderness.    Neurological: She is alert and oriented to person, place, and time.  Skin: Skin is warm and dry. She is not diaphoretic.    ED Course  Procedures (including critical care time) Medications  sodium chloride 0.9 % bolus 1,000 mL (0 mLs Intravenous Stopped  03/23/13 0227)  ketorolac (TORADOL) 30 MG/ML injection 30 mg (30 mg Intravenous Given 03/23/13 0125)  cefTRIAXone (ROCEPHIN) 1 g in dextrose 5 % 50 mL IVPB (0 g Intravenous Stopped 03/23/13 0434)  morphine 4 MG/ML injection 4 mg (4 mg Intravenous Given 03/23/13 0250)    Labs Review Labs Reviewed  COMPREHENSIVE METABOLIC PANEL - Abnormal; Notable for the following:    Glucose, Bld 104 (*)    Creatinine, Ser 1.18 (*)    GFR calc non Af Amer 61 (*)    GFR calc Af Amer 70 (*)    All other components within normal limits  CBC WITH DIFFERENTIAL - Abnormal; Notable for the following:    Monocytes Absolute 1.1 (*)    All other components within normal limits  URINALYSIS, ROUTINE W REFLEX MICROSCOPIC - Abnormal; Notable for the following:    APPearance TURBID (*)    Hgb urine dipstick MODERATE (*)    Ketones, ur 15 (*)    Protein, ur 100 (*)    Nitrite POSITIVE (*)    Leukocytes, UA LARGE (*)    All other components within normal limits  URINE MICROSCOPIC-ADD ON - Abnormal; Notable for the following:    Bacteria, UA MANY (*)    All other components within normal limits  URINE CULTURE  POC URINE PREG, ED   Imaging Review Ct Abdomen Pelvis Wo Contrast  03/23/2013   CLINICAL DATA:  Right lower quadrant pain.  EXAM: CT ABDOMEN AND PELVIS WITHOUT CONTRAST  TECHNIQUE: Multidetector CT imaging of the abdomen and pelvis was performed following the standard protocol without intravenous contrast.  COMPARISON:  None.  FINDINGS: Liver normal. Spleen normal. Pancreas normal. No biliary distention. Gallbladder nondistended.  The adrenals are normal. Left kidney is normal. Mild right hydronephrosis and hydroureter noted. Associated mild periureteral streaking on the right is noted. No definite stone is identified. Recently passed stone could present in this fashion. Other etiologies of obstructive uropathy cannot be entirely excluded. This should be followed with ultrasound and/or CT to demonstrate resolution.  Bladder is nondistended. Uterus and adnexa are unremarkable. Minimal free pelvic fluid.  No adenopathy.  Abdominal aorta normal in caliber.  Appendix is normal. No inflammatory changes are noted in the right or left lower quadrant. Stool is present throughout the colon. No bowel distention. No free air. No mesenteric masses.  Lung bases clear. Heart size normal. Tiny umbilical hernia is present with herniation of fat only. No acute bony abnormality identified.  IMPRESSION: 1. Mild right hydronephrosis and hydroureter to the level of the bladder. Recently passed stone could present in this fashion. Follow-up ultrasound and/or CT is suggested to demonstrate resolution to exclude other etiologies of ureteral obstruction. 2. No other significant abnormality.   Electronically Signed   By: Marcello Moores  Register   On:  03/23/2013 02:01     EKG Interpretation None      MDM   Final diagnoses:  Pyelonephritis  Abdominal pain    Filed Vitals:   03/23/13 0345  BP: 110/70  Pulse: 79  Temp:   Resp:     Afebrile, NAD, non-toxic appearing, AAOx4. Pt has been diagnosed with a Kidney Stone that has likely recently passed via CT. There is no evidence of significant hydronephrosis, serum creatine is mildly elevated, vitals sign stable and the pt does not have irratractable vomiting. Patient also noted to have urinary tract infection with likely pyelonephritis. 1 g of IV Rocephin given in the emergency department. Patient is an indication for inpatient treatment at this time. Pt will be dc home with pain medications & has been advised to follow up with PCP and/or urology for her repeat creatinine and a followup ultrasound or CT abdomen and pelvis to ensure a kidney stone is passed. Patient appears reliable for followup and is agreeable to plan. Patient stable at time of discharge.       Harlow Mares, PA-C 03/23/13 [phone]

## 2013-03-23 NOTE — Discharge Instructions (Signed)
Please follow up with your primary care physician in 1-2 days. If you do not have one please call the [facility] number listed above. Please follow up with Dr. Louis Meckel the urologist to schedule a follow up appointment. Please have a repeat CT abdomen/pelvis or ultrasound performed to ensure the kidney stone has truly passed. Please have your kidney function tests rechecked as well at that time. Please take your antibiotic until completion. Please take pain medication and/or muscle relaxants as prescribed and as needed for pain. Please do not drive on narcotic pain medication or on muscle relaxants. Please read all discharge instructions and return precautions.   Pyelonephritis, Adult Pyelonephritis is a kidney infection. In general, there are 2 main types of pyelonephritis:  Infections that come on quickly without any warning (acute pyelonephritis).  Infections that persist for a long period of time (chronic pyelonephritis). CAUSES  Two main causes of pyelonephritis are:  Bacteria traveling from the bladder to the kidney. This is a problem especially in pregnant women. The urine in the bladder can become filled with bacteria from multiple causes, including:  Inflammation of the prostate gland (prostatitis).  Sexual intercourse in females.  Bladder infection (cystitis).  Bacteria traveling from the bloodstream to the tissue part of the kidney. Problems that may increase your risk of getting a kidney infection include:  Diabetes.  Kidney stones or bladder stones.  Cancer.  Catheters placed in the bladder.  Other abnormalities of the kidney or ureter. SYMPTOMS   Abdominal pain.  Pain in the side or flank area.  Fever.  Chills.  Upset stomach.  Blood in the urine (dark urine).  Frequent urination.  Strong or persistent urge to urinate.  Burning or stinging when urinating. DIAGNOSIS  Your caregiver may diagnose your kidney infection based on your  symptoms. A urine sample may also be taken. TREATMENT  In general, treatment depends on how severe the infection is.   If the infection is mild and caught early, your caregiver may treat you with oral antibiotics and send you home.  If the infection is more severe, the bacteria may have gotten into the bloodstream. This will require intravenous (IV) antibiotics and a hospital stay. Symptoms may include:  High fever.  Severe flank pain.  Shaking chills.  Even after a hospital stay, your caregiver may require you to be on oral antibiotics for a period of time.  Other treatments may be required depending upon the cause of the infection. HOME CARE INSTRUCTIONS   Take your antibiotics as directed. Finish them even if you start to feel better.  Make an appointment to have your urine checked to make sure the infection is gone.  Drink enough fluids to keep your urine clear or pale yellow.  Take medicines for the bladder if you have urgency and frequency of urination as directed by your caregiver. SEEK IMMEDIATE MEDICAL CARE IF:   You have a fever or persistent symptoms for more than 2-3 days.  You have a fever and your symptoms suddenly get worse.  You are unable to take your antibiotics or fluids.  You develop shaking chills.  You experience extreme weakness or fainting.  There is no improvement after 2 days of treatment. MAKE SURE YOU:  Understand these instructions.  Will watch your condition.  Will get help right away if you are not doing well or get worse. Document Released: 12/23/2004 Document Revised: 06/24/2011 Document Reviewed: 05/29/2010 [facility] Patient Information 2014 [place], [place].   Kidney Stones  Kidney stones (urolithiasis) are deposits that form inside your kidneys. The intense pain is caused by the stone moving through the urinary tract. When the stone moves, the ureter goes into spasm around the stone. The stone is usually passed in the urine.  CAUSES    A disorder that makes certain neck glands produce too much parathyroid hormone (primary hyperparathyroidism).  A buildup of uric acid crystals, similar to gout in your joints.  Narrowing (stricture) of the ureter.  A kidney obstruction present at birth (congenital obstruction).  Previous surgery on the kidney or ureters.  Numerous kidney infections. SYMPTOMS   Feeling sick to your stomach (nauseous).  Throwing up (vomiting).  Blood in the urine (hematuria).  Pain that usually spreads (radiates) to the groin.  Frequency or urgency of urination. DIAGNOSIS   Taking a history and physical exam.  Blood or urine tests.  CT scan.  Occasionally, an examination of the inside of the urinary bladder (cystoscopy) is performed. TREATMENT   Observation.  Increasing your fluid intake.  Extracorporeal shock wave lithotripsy This is a noninvasive procedure that uses shock waves to break up kidney stones.  Surgery may be needed if you have severe pain or persistent obstruction. There are various surgical procedures. Most of the procedures are performed with the use of small instruments. Only small incisions are needed to accommodate these instruments, so recovery time is minimized. The size, location, and chemical composition are all important variables that will determine the proper choice of action for you. Talk to your health care provider to better understand your situation so that you will minimize the risk of injury to yourself and your kidney.  HOME CARE INSTRUCTIONS   Drink enough water and fluids to keep your urine clear or pale yellow. This will help you to pass the stone or stone fragments.  Strain all urine through the provided strainer. Keep all particulate matter and stones for your health care provider to see. The stone causing the pain may be as small as a grain of salt. It is very important to use the strainer each and every time you pass your urine. The collection of  your stone will allow your health care provider to analyze it and verify that a stone has actually passed. The stone analysis will often identify what you can do to reduce the incidence of recurrences.  Only take over-the-counter or prescription medicines for pain, discomfort, or fever as directed by your health care provider.  Make a follow-up appointment with your health care provider as directed.  Get follow-up X-rays if required. The absence of pain does not always mean that the stone has passed. It may have only stopped moving. If the urine remains completely obstructed, it can cause loss of kidney function or even complete destruction of the kidney. It is your responsibility to make sure X-rays and follow-ups are completed. Ultrasounds of the kidney can show blockages and the status of the kidney. Ultrasounds are not associated with any radiation and can be performed easily in a matter of minutes. SEEK MEDICAL CARE IF:  You experience pain that is progressive and unresponsive to any pain medicine you have been prescribed. SEEK IMMEDIATE MEDICAL CARE IF:   Pain cannot be controlled with the prescribed medicine.  You have a fever or shaking chills.  The severity or intensity of pain increases over 18 hours and is not relieved by pain medicine.  You develop a new onset of abdominal pain.  You feel faint or pass out.  You are unable to urinate. MAKE SURE YOU:   Understand these instructions.  Will watch your condition.  Will get help right away if you are not doing well or get worse. Document Released: 12/23/2004 Document Revised: 08/25/2012 Document Reviewed: 05/26/2012 [facility] Patient Information 2014 [place].

## 2013-03-23 NOTE — ED Notes (Signed)
Pt given warm blanket.

## 2013-03-23 NOTE — ED Notes (Signed)
Pt sts understanding to dc instructions. Pt ambulatory to exit without difficulty. Denies need for w/c.

## 2013-03-25 NOTE — ED Provider Notes (Signed)
Medical screening examination/treatment/procedure(s) were performed by non-physician practitioner and as supervising physician I was immediately available for consultation/collaboration.   EKG Interpretation None       Babette Relic, MD 03/25/13 1023

## 2013-03-27 LAB — URINE CULTURE

## 2013-04-01 ENCOUNTER — Other Ambulatory Visit: Payer: Self-pay | Admitting: Certified Nurse Midwife

## 2013-04-04 ENCOUNTER — Ambulatory Visit: Payer: BC Managed Care – PPO | Admitting: Certified Nurse Midwife

## 2013-04-04 NOTE — Telephone Encounter (Signed)
AEX scheduled for 04/12/13 @ 9:15  Tri-Previfem #28/0 refill sent to pharmacy to last patient until AEX

## 2013-04-12 ENCOUNTER — Telehealth: Payer: Self-pay | Admitting: Certified Nurse Midwife

## 2013-04-12 ENCOUNTER — Ambulatory Visit: Payer: BC Managed Care – PPO | Admitting: Certified Nurse Midwife

## 2013-04-12 NOTE — Telephone Encounter (Signed)
Patient canceled her aex appointment today. Patient is feeling ill "flu like" Patient rescheduled to 04/22/13 with Debbi.

## 2013-04-22 ENCOUNTER — Ambulatory Visit (INDEPENDENT_AMBULATORY_CARE_PROVIDER_SITE_OTHER): Payer: BC Managed Care – PPO | Admitting: Certified Nurse Midwife

## 2013-04-22 ENCOUNTER — Encounter: Payer: Self-pay | Admitting: Certified Nurse Midwife

## 2013-04-22 VITALS — BP 100/64 | HR 68 | Resp 16 | Ht 65.0 in | Wt 155.0 lb

## 2013-04-22 DIAGNOSIS — Z23 Encounter for immunization: Secondary | ICD-10-CM

## 2013-04-22 DIAGNOSIS — Z01419 Encounter for gynecological examination (general) (routine) without abnormal findings: Secondary | ICD-10-CM

## 2013-04-22 DIAGNOSIS — F411 Generalized anxiety disorder: Secondary | ICD-10-CM

## 2013-04-22 DIAGNOSIS — Z309 Encounter for contraceptive management, unspecified: Secondary | ICD-10-CM

## 2013-04-22 DIAGNOSIS — Z0000 Encounter for general adult medical examination without abnormal findings: Secondary | ICD-10-CM

## 2013-04-22 MED ORDER — NORGESTIM-ETH ESTRAD TRIPHASIC 0.18/0.215/0.25 MG-35 MCG PO TABS: 1.0000 | ORAL_TABLET | Freq: Every day | ORAL | Status: DC

## 2013-04-22 MED ORDER — ESCITALOPRAM OXALATE 20 MG PO TABS: 20.0000 mg | ORAL_TABLET | Freq: Every day | ORAL | Status: DC

## 2013-04-22 NOTE — Patient Instructions (Addendum)
General topics  Next pap or exam is  due in 1 year Take a Women's multivitamin Take 1200 mg. of calcium daily - prefer dietary If any concerns in interim to call back  Breast Self-Awareness Practicing breast self-awareness may pick up problems early, prevent significant medical complications, and possibly save your life. By practicing breast self-awareness, you can become familiar with how your breasts look and feel and if your breasts are changing. This allows you to notice changes early. It can also offer you some reassurance that your breast health is good. One way to learn what is normal for your breasts and whether your breasts are changing is to do a breast self-exam. If you find a lump or something that was not present in the past, it is best to contact your caregiver right away. Other findings that should be evaluated by your caregiver include nipple discharge, especially if it is bloody; skin changes or reddening; areas where the skin seems to be pulled in (retracted); or new lumps and bumps. Breast pain is seldom associated with cancer (malignancy), but should also be evaluated by a caregiver. BREAST SELF-EXAM The best time to examine your breasts is 5 7 days after your menstrual period is over.  ExitCare Patient Information 2013 Exit[employer].   Exercise to Stay Healthy Exercise helps you become and stay healthy. EXERCISE IDEAS AND TIPS Choose exercises that:  You enjoy.  Fit into your day. You do not need to exercise really hard to be healthy. You can do exercises at a slow or medium level and stay healthy. You can:  Stretch before and after working out.  Try yoga, Pilates, or tai chi.  Lift weights.  Walk fast, swim, jog, run, climb stairs, bicycle, dance, or rollerskate.  Take aerobic classes. Exercises that burn about 150 calories:  Running 1  miles in 15 minutes.  Playing volleyball for 45 to 60 minutes.  Washing and waxing a car for 45 to 60  minutes.  Playing touch football for 45 minutes.  Walking 1  miles in 35 minutes.  Pushing a stroller 1  miles in 30 minutes.  Playing basketball for 30 minutes.  Raking leaves for 30 minutes.  Bicycling 5 miles in 30 minutes.  Walking 2 miles in 30 minutes.  Dancing for 30 minutes.  Shoveling snow for 15 minutes.  Swimming laps for 20 minutes.  Walking up stairs for 15 minutes.  Bicycling 4 miles in 15 minutes.  Gardening for 30 to 45 minutes.  Jumping rope for 15 minutes.  Washing windows or floors for 45 to 60 minutes. Document Released: 01/25/2010 Document Revised: 03/17/2011 Document Reviewed: 01/25/2010 ExitCare Patient Information 2013 ExitCare[employer].   Other topics ( that may be useful information):    Sexually Transmitted Disease Sexually transmitted disease (STD) refers to any infection that is passed from person to person during sexual activity. This may happen by way of saliva, semen, blood, vaginal mucus, or urine. Common STDs include:  Gonorrhea.  Chlamydia.  Syphilis.  HIV/AIDS.  Genital herpes.  Hepatitis B and C.  Trichomonas.  Human papillomavirus (HPV).  Pubic lice. CAUSES  An STD may be spread by bacteria, virus, or parasite. A person can get an STD by:  Sexual intercourse with an infected person.  Sharing sex toys with an infected person.  Sharing needles with an infected person.  Having intimate contact with the genitals, mouth, or rectal areas of an infected person. SYMPTOMS  Some people may not have any symptoms, but   they can still pass the infection to others. Different STDs have different symptoms. Symptoms include:  Painful or bloody urination.  Pain in the pelvis, abdomen, vagina, anus, throat, or eyes.  Skin rash, itching, irritation, growths, or sores (lesions). These usually occur in the genital or anal area.  Abnormal vaginal discharge.  Penile discharge in men.  Soft, flesh-colored skin growths in the  genital or anal area.  Fever.  Pain or bleeding during sexual intercourse.  Swollen glands in the groin area.  Yellow skin and eyes (jaundice). This is seen with hepatitis. DIAGNOSIS  To make a diagnosis, your caregiver may:  Take a medical history.  Perform a physical exam.  Take a specimen (culture) to be examined.  Examine a sample of discharge under a microscope.  Perform blood test TREATMENT   Chlamydia, gonorrhea, trichomonas, and syphilis can be cured with antibiotic medicine.  Genital herpes, hepatitis, and HIV can be treated, but not cured, with prescribed medicines. The medicines will lessen the symptoms.  Genital warts from HPV can be treated with medicine or by freezing, burning (electrocautery), or surgery. Warts may come back.  HPV is a virus and cannot be cured with medicine or surgery.However, abnormal areas may be followed very closely by your caregiver and may be removed from the cervix, vagina, or vulva through office procedures or surgery. If your diagnosis is confirmed, your recent sexual partners need treatment. This is true even if they are symptom-free or have a negative culture or evaluation. They should not have sex until their caregiver says it is okay. HOME CARE INSTRUCTIONS  All sexual partners should be informed, tested, and treated for all STDs.  Take your antibiotics as directed. Finish them even if you start to feel better.  Only take over-the-counter or prescription medicines for pain, discomfort, or fever as directed by your caregiver.  Rest.  Eat a balanced diet and drink enough fluids to keep your urine clear or pale yellow.  Do not have sex until treatment is completed and you have followed up with your caregiver. STDs should be checked after treatment.  Keep all follow-up appointments, Pap tests, and blood tests as directed by your caregiver.  Only use latex condoms and water-soluble lubricants during sexual activity. Do not use  petroleum jelly or oils.  Avoid alcohol and illegal drugs.  Get vaccinated for HPV and hepatitis. If you have not received these vaccines in the past, talk to your caregiver about whether one or both might be right for you.  Avoid risky sex practices that can break the skin. The only way to avoid getting an STD is to avoid all sexual activity.Latex condoms and dental dams (for oral sex) will help lessen the risk of getting an STD, but will not completely eliminate the risk. SEEK MEDICAL CARE IF:   You have a fever.  You have any new or worsening symptoms. Document Released: 03/15/2002 Document Revised: 03/17/2011 Document Reviewed: 03/22/2010 [facility] Patient Information 2013 [place].    Domestic Abuse You are being battered or abused if someone close to you hits, pushes, or physically hurts you in any way. You also are being abused if you are forced into activities. You are being sexually abused if you are forced to have sexual contact of any kind. You are being emotionally abused if you are made to feel worthless or if you are constantly threatened. It is important to remember that help is available. No one has the right to abuse you. PREVENTION OF FURTHER  ABUSE  Learn the warning signs of danger. This varies with situations but may include: the use of alcohol, threats, isolation from friends and family, or forced sexual contact. Leave if you feel that violence is going to occur.  If you are attacked or beaten, report it to the police so the abuse is documented. You do not have to press charges. The police can protect you while you or the attackers are leaving. Get the officer's name and badge number and a copy of the report.  Find someone you can trust and tell them what is happening to you: your caregiver, a nurse, clergy member, close friend or family member. Feeling ashamed is natural, but remember that you have done nothing wrong. No one deserves abuse. Document Released:  12/21/1999 Document Revised: 03/17/2011 Document Reviewed: 02/28/2010 ExitCare Patient Information 2013 [employer]Care[employer].    How Much is Too Much Alcohol? Drinking too much alcohol can cause injury, accidents, and health problems. These types of problems can include:   Car crashes.  Falls.  Family fighting (domestic violence).  Drowning.  Fights.  Injuries.  Burns.  Damage to certain organs.  Having a baby with birth defects. ONE DRINK CAN BE TOO MUCH WHEN YOU ARE:  Working.  Pregnant or breastfeeding.  Taking medicines. Ask your doctor.  Driving or planning to drive. If you or someone you know has a drinking problem, get help from a doctor.  Document Released: 10/19/2008 Document Revised: 03/17/2011 Document Reviewed: 10/19/2008 ExitCare Patient Information 2013 ExitCare[employer].   Smoking Hazards Smoking cigarettes is extremely bad for your health. Tobacco smoke has over 200 known poisons in it. There are over 60 chemicals in tobacco smoke that cause cancer. Some of the chemicals found in cigarette smoke include:   Cyanide.  Benzene.  Formaldehyde.  Methanol (wood alcohol).  Acetylene (fuel used in welding torches).  Ammonia. Cigarette smoke also contains the poisonous gases nitrogen oxide and carbon monoxide.  Cigarette smokers have an increased risk of many serious medical problems and Smoking causes approximately:  90% of all lung cancer deaths in men.  80% of all lung cancer deaths in women.  90% of deaths from chronic obstructive lung disease. Compared with nonsmokers, smoking increases the risk of:  Coronary heart disease by 2 to 4 times.  Stroke by 2 to 4 times.  Men developing lung cancer by 23 times.  Women developing lung cancer by 13 times.  Dying from chronic obstructive lung diseases by 12 times.  . Smoking is the most preventable cause of death and disease in our society.  WHY IS SMOKING ADDICTIVE?  Nicotine is the chemical  agent in tobacco that is capable of causing addiction or dependence.  When you smoke and inhale, nicotine is absorbed rapidly into the bloodstream through your lungs. Nicotine absorbed through the lungs is capable of creating a powerful addiction. Both inhaled and non-inhaled nicotine may be addictive.  Addiction studies of cigarettes and spit tobacco show that addiction to nicotine occurs mainly during the teen years, when young people begin using tobacco products. WHAT ARE THE BENEFITS OF QUITTING?  There are many health benefits to quitting smoking.   Likelihood of developing cancer and heart disease decreases. Health improvements are seen almost immediately.  Blood pressure, pulse rate, and breathing patterns start returning to normal soon after quitting. QUITTING SMOKING   American Lung Association - 1-800-LUNGUSA  American Cancer Society - 1-800-ACS-2345 Document Released: 01/31/2004 Document Revised: 03/17/2011 Document Reviewed: 10/04/2008 ExitCare Patient Information 2013 ExitCare,   LLC.   Stress Management Stress is a state of physical or mental tension that often results from changes in your life or normal routine. Some common causes of stress are:  Death of a loved one.  Injuries or severe illnesses.  Getting fired or changing jobs.  Moving into a new home. Other causes may be:  Sexual problems.  Business or financial losses.  Taking on a large debt.  Regular conflict with someone at home or at work.  Constant tiredness from lack of sleep. It is not just bad things that are stressful. It may be stressful to:  Win the lottery.  Get married.  Buy a new car. The amount of stress that can be easily tolerated varies from person to person. Changes generally cause stress, regardless of the types of change. Too much stress can affect your health. It may lead to physical or emotional problems. Too little stress (boredom) may also become stressful. SUGGESTIONS TO  REDUCE STRESS:  Talk things over with your family and friends. It often is helpful to share your concerns and worries. If you feel your problem is serious, you may want to get help from a professional counselor.  Consider your problems one at a time instead of lumping them all together. Trying to take care of everything at once may seem impossible. List all the things you need to do and then start with the most important one. Set a goal to accomplish 2 or 3 things each day. If you expect to do too many in a single day you will naturally fail, causing you to feel even more stressed.  Do not use alcohol or drugs to relieve stress. Although you may feel better for a short time, they do not remove the problems that caused the stress. They can also be habit forming.  Exercise regularly - at least 3 times per week. Physical exercise can help to relieve that "uptight" feeling and will relax you.  The shortest distance between despair and hope is often a good night's sleep.  Go to bed and get up on time allowing yourself time for appointments without being rushed.  Take a short "time-out" period from any stressful situation that occurs during the day. Close your eyes and take some deep breaths. Starting with the muscles in your face, tense them, hold it for a few seconds, then relax. Repeat this with the muscles in your neck, shoulders, hand, stomach, back and legs.  Take good care of yourself. Eat a balanced diet and get plenty of rest.  Schedule time for having fun. Take a break from your daily routine to relax. HOME CARE INSTRUCTIONS   Call if you feel overwhelmed by your problems and feel you can no longer manage them on your own.  Return immediately if you feel like hurting yourself or someone else. Document Released: 06/18/2000 Document Revised: 03/17/2011 Document Reviewed: 02/08/2007 [facility] Patient Information 2013 [place].  Great to see you!! Debbi

## 2013-04-22 NOTE — Progress Notes (Signed)
33 y.o. G0P0000 Single Caucasian Fe here for annual exam. Periods normal, no issues. Contraception working well. Partner change desires STD screening. Patient seen in ER for kidney stones, passed. Busy with work, no issues. Lexapro working well for anxiety management, desires continuance.History of elevated cholestereol would like to come in fasting and have checked.No health issues today.   Patient's last menstrual period was 03/29/2013.          Sexually active: yes  The current method of family planning is OCP (estrogen/progesterone).    Exercising: no  exercise Smoker:  no  Health Maintenance: Pap:  08-07-11 LGSIL,, colpo no bx, no lesion seen MMG:  none Colonoscopy:  none BMD:   none TDaP:2004, Labs: none Self breast exam: not done   reports that she has never smoked. She does not have any smokeless tobacco history on file. She reports that she drinks about 7 ounces of alcohol per week. She reports that she does not use illicit drugs.  Past Medical History  Diagnosis Date  . Anxiety   . Hypercholesteremia   . 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 Kidney stones     Past Surgical History  Procedure Laterality Date  . Laparoscopy N/A 04/20/2012    Procedure: LAPAROSCOPY OPERATIVE;  Surgeon: Peri Maris, MD;  Location: [facility];  Service: Gynecology;  Laterality: N/A;  . Salpingoophorectomy Right 04/20/2012    Procedure: SALPINGO OOPHORECTOMY;  Surgeon: Peri Maris, MD;  Location: [facility];  Service: Gynecology;  Laterality: Right;  . Oophorectomy Right   . Colposcopy      normal 2012    Current Outpatient Prescriptions  Medication Sig Dispense Refill  . escitalopram (LEXAPRO) 20 MG tablet Take 1 tablet (20 mg total) by mouth daily.  30 tablet  2  . Norgestim-Eth Estrad Triphasic (TRI-SPRINTEC PO) Take 1 tablet by mouth daily.        No current facility-administered medications for this visit.    Family History  Problem Relation Age  of Onset  . Anxiety disorder Mother   . Anxiety disorder Sister   . Diabetes Maternal Grandfather     ROS:  Pertinent items are noted in HPI.  Otherwise, a comprehensive ROS was negative.  Exam:   BP 100/64  Pulse 68  Resp 16  Ht 5\' 5"  (1.651 m)  Wt 155 lb (70.308 kg)  BMI 25.79 kg/m2  LMP 03/29/2013 Height: 5\' 5"  (165.1 cm)  Ht Readings from Last 3 Encounters:  04/22/13 5\' 5"  (1.651 m)  01/18/13 5' 5.5" (1.664 m)  08/24/12 5' 5.5" (1.664 m)    General appearance: alert, cooperative and appears stated age Head: Normocephalic, without obvious abnormality, atraumatic Neck: no adenopathy, supple, symmetrical, trachea midline and thyroid normal to inspection and palpation and non-palpable Lungs: clear to auscultation bilaterally Breasts: normal appearance, no masses or tenderness, No nipple retraction or dimpling, No nipple discharge or bleeding, No axillary or supraclavicular adenopathy Heart: regular rate and rhythm Abdomen: soft, non-tender; no masses,  no organomegaly Extremities: extremities normal, atraumatic, no cyanosis or edema Skin: Skin color, texture, turgor normal. No rashes or lesions Lymph nodes: Cervical, supraclavicular, and axillary nodes normal. No abnormal inguinal nodes palpated Neurologic: Grossly normal   Pelvic: External genitalia:  no lesions              Urethra:  normal appearing urethra with no masses, tenderness or lesions  Bartholin's and Skene's: normal                 Vagina: normal appearing vagina with normal color and discharge, no lesions              Cervix: normal, non tender              Pap taken: yes Bimanual Exam:  Uterus:  normal size, contour, position, consistency, mobility, non-tender and anteverted              Adnexa: normal adnexa and no mass, fullness, tenderness               Rectovaginal: Confirms               Anus:  normal sphincter tone, no lesions  A:  Well Woman with normal exam  Contraception OCP  desired  Anxiety Lexapro working well  History of abnormal pap LSIL follow up pap today  Family history of elevated cholesterol  Immunization upddate  STD screening  P:   Reviewed health and wellness pertinent to exam  RX Trisprintec see order  Rx Lexapro see order, advise if having any changes  Schedule fasting labs: Lipid panel, CMP,HIV,RPR  Lab today:GC,Chlamydia  Requests TDAP at lab appointment scheduled  Pap smear as per guidelines   pap smear taken today with HPVHR if negative repeat one year if not per result  counseled on breast self exam, STD prevention, HIV risk factors and prevention, use and side effects of OCP's, adequate intake of calcium and vitamin D, diet and exercise  return annually or prn  An After Visit Summary was printed and given to the patient.

## 2013-04-26 LAB — IPS PAP TEST WITH HPV

## 2013-04-26 LAB — IPS N GONORRHOEA AND CHLAMYDIA BY PCR

## 2013-04-27 ENCOUNTER — Telehealth: Payer: Self-pay | Admitting: Certified Nurse Midwife

## 2013-04-27 ENCOUNTER — Other Ambulatory Visit: Payer: BC Managed Care – PPO

## 2013-04-27 ENCOUNTER — Other Ambulatory Visit: Payer: Self-pay | Admitting: Certified Nurse Midwife

## 2013-04-27 DIAGNOSIS — R87610 Atypical squamous cells of undetermined significance on cytologic smear of cervix (ASC-US): Secondary | ICD-10-CM

## 2013-04-27 NOTE — Telephone Encounter (Signed)
Left message on vm for pt to call office regarding missed lab appt.

## 2013-04-29 NOTE — Progress Notes (Signed)
Reviewed personally.  M. Suzanne Deloros Beretta, MD.  

## 2013-05-02 ENCOUNTER — Telehealth: Payer: Self-pay | Admitting: Emergency Medicine

## 2013-05-02 NOTE — Telephone Encounter (Signed)
Message copied by Michele Mcalpine on Mon May 02, 2013 10:19 AM ------      Message from: Regina Eck      Created: Wed Apr 27, 2013  8:29 AM       Notify patient that GC,Chlamydia negative      Pap smear abnormal again with ASCUS with positive HPVHR, will need another colposcopy.      We discussed at visit if abnormal this would be the next step.      Order in ------

## 2013-05-02 NOTE — Telephone Encounter (Addendum)
Message left to return call to [place] at [phone].   On OCP  ASCUS with HR HPV  Precert done: $21.19

## 2013-05-03 NOTE — Telephone Encounter (Signed)
Patient returned call. Message from Regina Eck CNM given. Patient is agreeable to scheduling colposcopy at this time. LMP 04/27/13. Currently on ocpy. Scheduled for 05/09/13 at 1400 with Regina Eck CNM.   Colposcopy pre-procedure instructions given. Motrin instructions given. Motrin=Advil=Ibuprofen Can take 800 mg (Can purchase over the counter, you will need four 200 mg pills) every 8 hours as needed.  Take with food. Make sure to eat a meal before appointment and drink plenty of fluids. Patient verbalized understanding and will call to reschedule if will be on menses or has any concerns regarding pregnancy. Advised will need to cancel within 24 hours or will have $100.00 no show fee placed to account.  Routing to provider for final review. Patient agreeable to disposition. Will close encounter

## 2013-05-03 NOTE — Telephone Encounter (Signed)
Message copied by Michele Mcalpine on Tue May 03, 2013  2:37 PM ------      Message from: Regina Eck      Created: Wed Apr 27, 2013  8:29 AM       Notify patient that GC,Chlamydia negative      Pap smear abnormal again with ASCUS with positive HPVHR, will need another colposcopy.      We discussed at visit if abnormal this would be the next step.      Order in ------

## 2013-05-09 ENCOUNTER — Ambulatory Visit (INDEPENDENT_AMBULATORY_CARE_PROVIDER_SITE_OTHER): Payer: BC Managed Care – PPO | Admitting: Certified Nurse Midwife

## 2013-05-09 ENCOUNTER — Encounter: Payer: Self-pay | Admitting: Certified Nurse Midwife

## 2013-05-09 VITALS — BP 104/68 | HR 72 | Resp 16 | Ht 65.0 in | Wt 152.0 lb

## 2013-05-09 DIAGNOSIS — Z0189 Encounter for other specified special examinations: Secondary | ICD-10-CM

## 2013-05-09 DIAGNOSIS — R87619 Unspecified abnormal cytological findings in specimens from cervix uteri: Secondary | ICD-10-CM

## 2013-05-09 DIAGNOSIS — Z0000 Encounter for general adult medical examination without abnormal findings: Secondary | ICD-10-CM

## 2013-05-09 DIAGNOSIS — R87610 Atypical squamous cells of undetermined significance on cytologic smear of cervix (ASC-US): Secondary | ICD-10-CM

## 2013-05-09 DIAGNOSIS — N760 Acute vaginitis: Secondary | ICD-10-CM

## 2013-05-09 LAB — LIPID PANEL
CHOL/HDL RATIO: 3.5 ratio
Cholesterol: 280 mg/dL — ABNORMAL HIGH (ref 0–200)
HDL: 81 mg/dL (ref >39)
LDL CALC: 154 mg/dL — AB (ref 0–99)
Triglycerides: 225 mg/dL — ABNORMAL HIGH (ref <150)
VLDL: 45 mg/dL — AB (ref 0–40)

## 2013-05-09 MED ORDER — METRONIDAZOLE 500 MG PO TABS: 500.0000 mg | ORAL_TABLET | Freq: Two times a day (BID) | ORAL | Status: DC

## 2013-05-09 NOTE — Progress Notes (Addendum)
Patient ID: Lauren Moran, female   DOB: 1980-10-29, 33 y.o.   MRN: [id number]  Chief Complaint  Patient presents with  . Colposcopy    HPI Lauren Moran is a 33 y.o.  White  female G0P0, here for colposcopy. Denies vaginal bleeding or vaginal discharge or vaginal pain.  HPI  Indications: Pap smear on 04/22/13 showed: ASCUS with POSITIVE high risk HPV. Previous colposcopy: CIN 1 and in 2008 CIN1. Prior cervical treatment: no treatment.  Past Medical History  Diagnosis Date  . Anxiety   . Hypercholesteremia   . 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 Kidney stones     Past Surgical History  Procedure Laterality Date  . Laparoscopy N/A 04/20/2012    Procedure: LAPAROSCOPY OPERATIVE;  Surgeon: Peri Maris, MD;  Location: [facility];  Service: Gynecology;  Laterality: N/A;  . Salpingoophorectomy Right 04/20/2012    Procedure: SALPINGO OOPHORECTOMY;  Surgeon: Peri Maris, MD;  Location: [facility];  Service: Gynecology;  Laterality: Right;  . Oophorectomy Right   . Colposcopy      normal 2012,previous CIN1, VAIN1    Family History  Problem Relation Age of Onset  . Anxiety disorder Mother   . Anxiety disorder Sister   . Diabetes Maternal Grandfather     Social History History  Substance Use Topics  . Smoking status: Never Smoker   . Smokeless tobacco: Not on file  . Alcohol Use: 7.0 oz/week    14 drink(s) per week    No Known Allergies  Current Outpatient Prescriptions  Medication Sig Dispense Refill  . escitalopram (LEXAPRO) 20 MG tablet Take 1 tablet (20 mg total) by mouth daily.  30 tablet  12  . Norgestimate-Ethinyl Estradiol Triphasic (TRI-SPRINTEC) 0.18/0.215/0.25 MG-35 MCG tablet Take 1 tablet by mouth daily.  1 Package  12   No current facility-administered medications for this visit.    Review of Systems Review of Systems  Constitutional: Negative.   Genitourinary: Negative for vaginal bleeding, vaginal  discharge and vaginal pain.    Blood pressure 104/68, pulse 72, resp. rate 16, height 5\' 5"  (1.651 m), weight 152 lb (68.947 kg), last menstrual period 04/26/2013.  Physical Exam Physical Exam  Constitutional: She is oriented to person, place, and time. She appears well-developed and well-nourished.  Genitourinary: Vaginal discharge found.    Neurological: She is alert and oriented to person, place, and time.  Skin: Skin is warm and dry.  Psychiatric: She has a normal mood and affect. Judgment normal.  Vaginal discharge noted with odor and watery appearance. Wet Prep taken.  Data Reviewed Reviewed pap smear results with patient. Questions addressed.  Assessment History of ASCUS PAP with + HPVHR, here for colposcopy Wet Prep: Positive for BV    Procedure Details  The risks and benefits of the procedure and Written informed consent obtained.  Speculum placed in vagina and excellent visualization of cervix achieved, wet prep taken, cervix swabbed x 3 with saline solutions and with acetic acid solution. Acetowhite response noted at 12 and 5 o'clock. Cervix viewed with 3.75,7.5,15 # and green filter. Lugols applied with non staining noted in same areas. Biopsy taken at  5 and 12 o'clock, in that order. ECC obtained. Monsels applied. No active bleeding noted on speculum removal. Patient tolerated procedure well. Instructions given.  Specimens: 3  Complications: none.     Plan   Discussed findings of BV and etiology. Rx Flagyl see order  with instructions. Specimens labelled and sent to Pathology. Patient to be notified when pathology has been reviewed.   Pathology reviewed which showed both biopsies showed LSIL, Mild dysplasia consistent with HPV effect. ECC showed no atypia or carcinoma of the endocervical glands. Patient will be notified of results and need for repeat pap smear in one year. Pap recall   Regina Eck 05/09/2013, 2:42 PM

## 2013-05-09 NOTE — Progress Notes (Signed)
Pt has hx of CIN1, VAIN1, previous colpo 2012 normal Pap smear ASCUS +HPV on 04-22-13

## 2013-05-09 NOTE — Patient Instructions (Signed)

## 2013-05-10 LAB — VITAMIN D 25 HYDROXY (VIT D DEFICIENCY, FRACTURES): VIT D 25 HYDROXY: 26 ng/mL — AB (ref 30–89)

## 2013-05-10 LAB — HSV(HERPES SIMPLEX VRS) I + II AB-IGG
HSV 1 GLYCOPROTEIN G AB, IGG: 0.43 IV
HSV 2 Glycoprotein G Ab, IgG: <0.1 IV

## 2013-05-10 LAB — HIV ANTIBODY (ROUTINE TESTING W REFLEX): HIV: NONREACTIVE

## 2013-05-10 LAB — RPR

## 2013-05-12 ENCOUNTER — Encounter: Payer: Self-pay | Admitting: Obstetrics & Gynecology

## 2013-05-12 NOTE — Progress Notes (Signed)
Reviewed personally.  M. Suzanne Kielyn Kardell, MD.  

## 2013-05-13 ENCOUNTER — Telehealth: Payer: Self-pay

## 2013-05-13 LAB — IPS OTHER TISSUE BIOPSY

## 2013-05-13 NOTE — Telephone Encounter (Signed)
Spoke with patient. Results given as seen below from [place]. Patient agreeable and verbalizes understanding. Patient states she does not have current PCP. Advised we could recommend someone for her to see and send over a referral as follow up is important. Patient declines stating "I think I am going to try to bring it down on my own with diet and exercise. It has been higher than this before. It has been in the 300's. I would like to try this first." Advised we recommended seeing PCP for follow up and management. Patient will call back if she would like referral place to PCP.   Notes Recorded by Regina Eck, CNM on 05/10/2013 at 3:46 PM Notify patient that cholesterol,triglycerides and LDL cholesterol(harmful) are high, HDL helpful cholesterol good. Patient needs follow up and management if indicated by PCP. Does she have one? If so need to send recent results and she needs to call to schedule an appointment to discuss. Vitamin D low again was 42 now 26. Needs to start on OTC Vitamin D3 1000 IU daily Recheck at [facility] HIV,RPR,GC,Chlamydia, HSV 1,2 all negative      Routing to provider for final review. Patient agreeable to disposition. Will close encounter

## 2013-05-18 ENCOUNTER — Other Ambulatory Visit: Payer: Self-pay | Admitting: Certified Nurse Midwife

## 2013-05-19 NOTE — Telephone Encounter (Signed)
eScribe request from [facility] for refill on LEXAPRO Last filled - 04/22/13, #30 X 12 Last AEX - 04/22/13 Next AEX - not scheduled. RX denied as refilled on 04/22/13 for one year.

## 2013-06-01 ENCOUNTER — Inpatient Hospital Stay (HOSPITAL_COMMUNITY): Payer: BC Managed Care – PPO

## 2013-06-01 ENCOUNTER — Encounter (HOSPITAL_COMMUNITY): Payer: Self-pay | Admitting: *Deleted

## 2013-06-01 ENCOUNTER — Inpatient Hospital Stay (HOSPITAL_COMMUNITY)
Admission: AD | Admit: 2013-06-01 | Discharge: 2013-06-02 | Disposition: A | Discharge status: Home / Self Care | Payer: Self-pay | Source: Ambulatory Visit | Attending: Obstetrics & Gynecology | Admitting: Obstetrics & Gynecology

## 2013-06-01 DIAGNOSIS — R197 Diarrhea, unspecified: Secondary | ICD-10-CM | POA: Insufficient documentation

## 2013-06-01 DIAGNOSIS — R112 Nausea with vomiting, unspecified: Secondary | ICD-10-CM | POA: Insufficient documentation

## 2013-06-01 DIAGNOSIS — R109 Unspecified abdominal pain: Secondary | ICD-10-CM | POA: Insufficient documentation

## 2013-06-01 DIAGNOSIS — Z87442 Personal history of urinary calculi: Secondary | ICD-10-CM | POA: Insufficient documentation

## 2013-06-01 DIAGNOSIS — A049 Bacterial intestinal infection, unspecified: Secondary | ICD-10-CM

## 2013-06-01 LAB — URINALYSIS, ROUTINE W REFLEX MICROSCOPIC
BILIRUBIN URINE: NEGATIVE
GLUCOSE, UA: NEGATIVE mg/dL
HGB URINE DIPSTICK: NEGATIVE
Ketones, ur: NEGATIVE mg/dL
Leukocytes, UA: NEGATIVE
Nitrite: NEGATIVE
PROTEIN: NEGATIVE mg/dL
Specific Gravity, Urine: 1.025 (ref 1.005–1.030)
Urobilinogen, UA: 0.2 mg/dL (ref 0.0–1.0)
pH: 5.5 (ref 5.0–8.0)

## 2013-06-01 LAB — CBC
HEMATOCRIT: 40.5 % (ref 36.0–46.0)
Hemoglobin: 13.5 g/dL (ref 12.0–15.0)
MCH: 30.6 pg (ref 26.0–34.0)
MCHC: 33.3 g/dL (ref 30.0–36.0)
MCV: 91.8 fL (ref 78.0–100.0)
Platelets: 378 10*3/uL (ref 150–400)
RBC: 4.41 MIL/uL (ref 3.87–5.11)
RDW: 12.9 % (ref 11.5–15.5)
WBC: 9.2 10*3/uL (ref 4.0–10.5)

## 2013-06-01 LAB — COMPREHENSIVE METABOLIC PANEL
ALK PHOS: 70 U/L (ref 39–117)
ALT: 17 U/L (ref 0–35)
AST: 20 U/L (ref 0–37)
Albumin: 3.4 g/dL — ABNORMAL LOW (ref 3.5–5.2)
BILIRUBIN TOTAL: 0.3 mg/dL (ref 0.3–1.2)
BUN: 9 mg/dL (ref 6–23)
CHLORIDE: 100 meq/L (ref 96–112)
CO2: 26 meq/L (ref 19–32)
CREATININE: 0.86 mg/dL (ref 0.50–1.10)
Calcium: 9.4 mg/dL (ref 8.4–10.5)
GFR calc Af Amer: >90 mL/min (ref >90)
GFR calc non Af Amer: 89 mL/min — ABNORMAL LOW (ref >90)
Glucose, Bld: 85 mg/dL (ref 70–99)
Potassium: 4.5 mEq/L (ref 3.7–5.3)
Sodium: 138 mEq/L (ref 137–147)
Total Protein: 7.5 g/dL (ref 6.0–8.3)

## 2013-06-01 LAB — LIPASE, BLOOD: LIPASE: 19 U/L (ref 11–59)

## 2013-06-01 LAB — POCT PREGNANCY, URINE: Preg Test, Ur: NEGATIVE

## 2013-06-01 LAB — AMYLASE: AMYLASE: 35 U/L (ref 0–105)

## 2013-06-01 MED ORDER — IOHEXOL 300 MG/ML  SOLN
50.0000 mL | INTRAMUSCULAR | Status: AC
  Administered 2013-06-01: 50 mL via ORAL

## 2013-06-01 MED ORDER — IOHEXOL 300 MG/ML  SOLN
100.0000 mL | Freq: Once | INTRAMUSCULAR | Status: AC | PRN
  Administered 2013-06-01: 100 mL via INTRAVENOUS

## 2013-06-01 MED ORDER — ONDANSETRON HCL 4 MG/2ML IJ SOLN
4.0000 mg | Freq: Once | INTRAMUSCULAR | Status: AC
  Administered 2013-06-01: 4 mg via INTRAVENOUS
  Filled 2013-06-01: qty 2

## 2013-06-01 NOTE — MAU Note (Signed)
Patient states that she started having diarrhea and nausea with occasional vomiting on 5-21. States she has pain with eating and more pain on the right lower abdomen. Has night sweats and felt feverish off and on. Denies vaginal bleeding or discharge. Had a kidney stones and pyelo in March and passed it and this does not feel like that now, thought so at first. 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

## 2013-06-01 NOTE — MAU Provider Note (Signed)
Chief Complaint: Abdominal Pain   First Provider Initiated Contact with Patient 06/01/13 2033    SUBJECTIVE HPI: Lauren Moran is a 33 y.o. G0P0000 female who presents with nausea and vomiting, diarrhea and low abdominal pain x1 week. Abdominal pain is greater in the RLQ. She is concerned that she may have appendicitis. Nausea and vomiting have been sporadic, but diarrhea has been frequent--sometimes as often as hourly. Patient describes the diarrhea as watery with mucus, no blood. She reports alternating chills and sweats since the onset of GI symptoms, but has not checked her temperature. She states she went to urgent care for the symptoms earlier today, but [facility] there was a GYN cause for her pain. She had a right salpingectomy and oophorectomy in 2014 for a benign cyst.  She rates her pain 4/10 on pain scale right now, but 6-7/10 on pain at worst. She Took Aleve for abdominal pain the first 2 days of the illness, but has not taken any other medications for pain, nausea or vomiting since then. She describes the pain as crampy, sharp, worse after eating. Pain improves after having diarrhea, but soreness persists between episodes.  She denies recent travel or hospitalization. She was seen in the emergency room in March for a kidney stone and received Rocephin and Cipro. She was prescribed Flagyl in early May for bacterial vaginosis.  Patient's last menstrual period was 05/22/2013.  Past Medical History  Diagnosis Date  . Anxiety   . Hypercholesteremia   . 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 Kidney stones    OB History  Gravida Para Term Preterm AB SAB TAB Ectopic Multiple Living  0 0 0 0 0 0 0 0 0 0        Past Surgical History  Procedure Laterality Date  . Laparoscopy N/A 04/20/2012    Procedure: LAPAROSCOPY OPERATIVE;  Surgeon: Peri Maris, MD;  Location: [facility];  Service: Gynecology;   Laterality: N/A;  . Salpingoophorectomy Right 04/20/2012    Procedure: SALPINGO OOPHORECTOMY;  Surgeon: Peri Maris, MD;  Location: [facility];  Service: Gynecology;  Laterality: Right;  . Oophorectomy Right   . Colposcopy      normal 2012,previous CIN1, VAIN1   History   Social History  . Marital Status: Single    Spouse Name: N/A    Number of Children: N/A  . Years of Education: N/A   Occupational History  . Not on file.   Social History Main Topics  . Smoking status: Never Smoker   . Smokeless tobacco: Never Used  . Alcohol Use: 7.0 oz/week    14 drink(s) per week  . Drug Use: No  . Sexual Activity: Yes    Partners: Male    Birth Control/ Protection: Pill   Other Topics Concern  . Not on file   Social History Narrative  . No narrative on file   No current facility-administered medications on file prior to encounter.   Current Outpatient Prescriptions on File Prior to Encounter  Medication Sig Dispense Refill  . escitalopram (LEXAPRO) 20 MG tablet Take 1 tablet (20 mg total) by mouth daily.  30 tablet  12  . metroNIDAZOLE (FLAGYL) 500 MG tablet Take 1 tablet (500 mg total) by mouth 2 (two) times daily.  14 tablet  0  . Norgestimate-Ethinyl Estradiol Triphasic (TRI-SPRINTEC) 0.18/0.215/0.25 MG-35 MCG tablet Take 1 tablet by mouth daily.  1 Package  12   No Known Allergies  ROS: Pertinent positive items in HPI. Negative for hematemesis, bloody stools, vaginal bleeding, vaginal discharge, dyspareunia, dysuria, urgency, frequency, hematuria, flank pain.  OBJECTIVE Blood pressure 128/92, pulse 78, temperature 99 F (37.2 C), temperature source Oral, resp. rate 16, height 5' 4.4" (1.636 m), weight 66.407 kg (146 lb 6.4 oz), last menstrual period 05/22/2013, SpO2 98.00%. GENERAL: Well-developed, well-nourished female in mild distress.  HEENT: Normocephalic. Mucous membranes moist. HEART: normal rate RESP: normal effort ABDOMEN: Soft, mild suprapubic, right lower  quadrant and right-. Umbilical tenderness. Negative mass or rebound. Hyperactive bowel sounds in all 4 quadrants. Negative CVA tenderness. EXTREMITIES: Nontender, no edema NEURO: Alert and oriented SPECULUM EXAM: NEFG, moderate amount of clear, odorless, mucoid discharge consistent with ovulation, no blood noted, cervix clean BIMANUAL: cervix closed; uterus normal size, no adnexal tenderness or masses. No cervical motion tenderness.  LAB RESULTS Results for orders placed during the hospital encounter of 06/01/13 (from the past 24 hour(s))  URINALYSIS, ROUTINE W REFLEX MICROSCOPIC     Status: None   Collection Time    06/01/13  6:05 PM      Result Value Ref Range   Color, Urine YELLOW  YELLOW   APPearance CLEAR  CLEAR   Specific Gravity, Urine 1.025  1.005 - 1.030   pH 5.5  5.0 - 8.0   Glucose, UA NEGATIVE  NEGATIVE mg/dL   Hgb urine dipstick NEGATIVE  NEGATIVE   Bilirubin Urine NEGATIVE  NEGATIVE   Ketones, ur NEGATIVE  NEGATIVE mg/dL   Protein, ur NEGATIVE  NEGATIVE mg/dL   Urobilinogen, UA 0.2  0.0 - 1.0 mg/dL   Nitrite NEGATIVE  NEGATIVE   Leukocytes, UA NEGATIVE  NEGATIVE  POCT PREGNANCY, URINE     Status: None   Collection Time    06/01/13  6:26 PM      Result Value Ref Range   Preg Test, Ur NEGATIVE  NEGATIVE  CBC     Status: None   Collection Time    06/01/13  6:40 PM      Result Value Ref Range   WBC 9.2  4.0 - 10.5 K/uL   RBC 4.41  3.87 - 5.11 MIL/uL   Hemoglobin 13.5  12.0 - 15.0 g/dL   HCT 40.5  36.0 - 46.0 %   MCV 91.8  78.0 - 100.0 fL   MCH 30.6  26.0 - 34.0 pg   MCHC 33.3  30.0 - 36.0 g/dL   RDW 12.9  11.5 - 15.5 %   Platelets 378  150 - 400 K/uL  COMPREHENSIVE METABOLIC PANEL     Status: Abnormal   Collection Time    06/01/13  6:40 PM      Result Value Ref Range   Sodium 138  137 - 147 mEq/L   Potassium 4.5  3.7 - 5.3 mEq/L   Chloride 100  96 - 112 mEq/L   CO2 26  19 - 32 mEq/L   Glucose, Bld 85  70 - 99 mg/dL   BUN 9  6 - 23 mg/dL   Creatinine, Ser  0.86  0.50 - 1.10 mg/dL   Calcium 9.4  8.4 - 10.5 mg/dL   Total Protein 7.5  6.0 - 8.3 g/dL   Albumin 3.4 (*) 3.5 - 5.2 g/dL   AST 20  0 - 37 U/L   ALT 17  0 - 35 U/L   Alkaline Phosphatase 70  39 - 117 U/L   Total Bilirubin  0.3  0.3 - 1.2 mg/dL   GFR calc non Af Amer 89 (*) >90 mL/min   GFR calc Af Amer >90  >90 mL/min  AMYLASE     Status: None   Collection Time    06/01/13  6:40 PM      Result Value Ref Range   Amylase 35  0 - 105 U/L  LIPASE, BLOOD     Status: None   Collection Time    06/01/13  6:40 PM      Result Value Ref Range   Lipase 19  11 - 59 U/L    IMAGING Ct Abdomen Pelvis W Contrast  06/02/2013   CLINICAL DATA:  RIGHT lower quadrant pain for 7 days question appendicitis, history kidney stones  EXAM: CT ABDOMEN AND PELVIS WITH CONTRAST  TECHNIQUE: Multidetector CT imaging of the abdomen and pelvis was performed using the standard protocol following bolus administration of intravenous contrast.  CONTRAST:  127mL OMNIPAQUE IOHEXOL 300 MG/ML SOLN IV. Dilute oral contrast.  COMPARISON:  03/23/2013  FINDINGS: Lung bases clear.  Liver, spleen, pancreas, kidneys, and adrenal glands normal appearance.  Unremarkable bladder, ureters, uterus, the and LEFT ovary.  RIGHT ovary surgically absent.  Normal appendix.  Stomach and small bowel loops normal appearance.  Colon is unopacified and under distended with question slightly increased mucosal/wall enhancement distally.  Unable to exclude wall thickening/colitis at the distal descending and sigmoid colon.  No mass, adenopathy, free fluid, or free air.  Bones unremarkable.  IMPRESSION: Normal appendix.  Questionable wall thickening and increased mucosal enhancement of the distal descending and sigmoid colon though these are under distended and unopacified, cannot exclude distal colitis.   Electronically Signed   By: Lavonia Dana M.D.   On: 06/02/2013 00:35   MAU COURSE CBC, CMP, UA, Zofran, CT abdomen pelvis with contrast per consult with  Dr.Eure. Attempted to collect stool cultures, but patient unable to collect adequate sample. No vomiting or diarrhea while in MAU.  ASSESSMENT 1.  Suspected Bacterial gastroenteritis   PLAN Discharge home in stable condition. Patient given specimen containers and instructions for collection of stool specimen. Instructed to bring specimen back to maternity admissions. Offered empiric treatment with Flagyl. Patient declines due to GI upset from Flagyl. Clear liquid diet for 24-48 hours, then advance slowly. Encouraged small, frequent sips of electrolyte-containing fluids. Take Imodium as needed for diarrhea. Follow-up Information   Follow up with [facility]. (As needed, If symptoms worsen)    Contact information:   [place] [phone]        Medication List    STOP taking these medications       metroNIDAZOLE 500 MG tablet  Commonly known as:  FLAGYL      TAKE these medications       escitalopram 20 MG tablet  Commonly known as:  LEXAPRO  Take 1 tablet (20 mg total) by mouth daily.     Norgestimate-Ethinyl Estradiol Triphasic 0.18/0.215/0.25 MG-35 MCG tablet  Commonly known as:  TRI-SPRINTEC  Take 1 tablet by mouth daily.     ZOFRAN 4 MG tablet  Generic drug:  ondansetron  Take 1 tablet (4 mg total) by mouth every 6 (six) hours as needed for nausea or vomiting.       [place], [place] 06/01/2013  8:23 PM

## 2013-06-02 DIAGNOSIS — A049 Bacterial intestinal infection, unspecified: Secondary | ICD-10-CM

## 2013-06-02 LAB — WET PREP, GENITAL
Trich, Wet Prep: NONE SEEN
Yeast Wet Prep HPF POC: NONE SEEN

## 2013-06-02 LAB — GC/CHLAMYDIA PROBE AMP
CT Probe RNA: NEGATIVE
GC PROBE AMP APTIMA: NEGATIVE

## 2013-06-02 MED ORDER — DIPHENOXYLATE-ATROPINE 2.5-0.025 MG PO TABS: 2.0000 | ORAL_TABLET | Freq: Once | ORAL | Status: DC

## 2013-06-02 NOTE — Discharge Instructions (Signed)
You most likely have bacterial gastroenteritis. We will not know exactly what type you have until your stool culture results are back in approximately 3 days. Below are instructions about one particular type of gastroenteritis called salmonella.   Salmonella Gastroenteritis, Adult Salmonella gastroenteritis occurs when certain bacteria infect the intestines. People usually begin to feel ill within 72 hours after the infection occurs. The illness can last from 2 days to 2 weeks. Elderly and immunocompromised people are at the greatest risk of this infection. Most people recover completely. However, salmonella bacteria can spread from the intestines to the blood and other parts of the body. In rare cases, a person may develop reactive arthritis with pain in the joints, irritation of the eyes, and painful urination. CAUSES  Salmonella gastroenteritis usually occurs after eating food or drinking liquids that are contaminated with salmonella bacteria. Common causes of this contamination include:  Poor personal hygiene.  Poor kitchen hygiene.  Drinking polluted, standing water.  Contact with carriers of the bacteria. Reptiles are strongly associated with the bacteria, but other animals may carry the bacteria, as well. SYMPTOMS   Nausea.  Vomiting.  Abdominal pain or cramps.  Diarrhea, which may be bloody.  Fever.  Headache. DIAGNOSIS  Your caregiver will take your medical history and perform a physical exam. A blood or stool sample may also be taken and tested for the presence of salmonella bacteria. TREATMENT  Often, no treatment is needed. However, you will need to drink plenty of fluids to prevent dehydration. In severe cases, antibiotic medicines may be given to help shorten the illness. PREVENTION  To prevent future salmonella infections:  Handle meat, eggs, seafood, and poultry properly.  Wash your hands and counters thoroughly after handling or preparing meat, eggs, seafood, and  poultry.  Always cook meat, eggs, seafood, and poultry thoroughly.  Wash your hands thoroughly after handling animals. HOME CARE INSTRUCTIONS  Drink enough fluids to keep your urine clear or pale yellow. Until your diarrhea, nausea, or vomiting is under control, you should only drink clear liquids. Clear liquids are anything you can see through, such as water, broth, or non-caffeinated tea. Avoid:  Milk.  Fruit juice.  Alcohol.  Extremely hot or cold fluids.  If you do not have an appetite, do not force yourself to eat. However, you must continue to drink fluids.  If you have an appetite, eat a normal diet unless your caregiver tells you differently.  Eat a variety of complex carbohydrates (rice, wheat, potatoes, bread), lean meats, yogurt, fruits, and vegetables.  Avoid high-fat foods because they are more difficult to digest.  If you are dehydrated, ask your caregiver for specific rehydration instructions. Signs of dehydration may include:  Severe thirst.  Dry lips and mouth.  Dizziness.  Dark urine.  Decreasing urine frequency and amount.  Confusion.  Rapid breathing or pulse.  If you are given antibiotics, take them as directed. Finish them even if you start to feel better.  Only take over-the-counter or prescription medicines for pain, discomfort, or fever as directed by your caregiver. Antidiarrheal medicines are not recommended.  Keep all follow-up appointments as directed by your caregiver. SEEK IMMEDIATE MEDICAL CARE IF:   You are unable to keep fluids down.  You have persistent vomiting or diarrhea.  You have abdominal pain that increases or is concentrated in one small area (localized).  Your diarrhea contains increased blood or mucus.  You feel very weak, dizzy, thirsty, or you faint.  You lose a significant amount of  weight. Your caregiver can tell you how much weight loss should concern you.  You have a fever. MAKE SURE YOU:   Understand  these instructions.  Will watch your condition.  Will get help right away if you are not doing well or get worse. Document Released: 12/21/1999 Document Revised: 06/24/2011 Document Reviewed: 02/20/2011 [facility] Patient Information 2014 [place].

## 2013-06-06 ENCOUNTER — Telehealth: Payer: Self-pay | Admitting: Certified Nurse Midwife

## 2013-06-06 NOTE — Telephone Encounter (Signed)
Patient calling to ask for "a referral to a specialist to treat her for salmonella." She states she has had salmonella for about two weeks. She further states she was seen at an ER but the labs were not sent out in error so she has not taken any antibiotics.

## 2013-06-07 NOTE — Telephone Encounter (Signed)
Message left to return call to [place] at [phone].   Patient was seen in Maternal Admissions Unit at [facility] on 06/01/13 and dx with bacterial gastroenteritis. Appears stool cultures were cancelled. Triage on return call. No pcp for patient listed.

## 2013-06-14 ENCOUNTER — Emergency Department (HOSPITAL_COMMUNITY)
Admission: EM | Admit: 2013-06-14 | Discharge: 2013-06-14 | Disposition: A | Discharge status: Home / Self Care | Payer: BC Managed Care – PPO | Attending: Emergency Medicine | Admitting: Emergency Medicine

## 2013-06-14 ENCOUNTER — Encounter (HOSPITAL_COMMUNITY): Payer: Self-pay | Admitting: Emergency Medicine

## 2013-06-14 DIAGNOSIS — R112 Nausea with vomiting, unspecified: Secondary | ICD-10-CM | POA: Insufficient documentation

## 2013-06-14 DIAGNOSIS — R197 Diarrhea, unspecified: Secondary | ICD-10-CM

## 2013-06-14 DIAGNOSIS — Z87442 Personal history of urinary calculi: Secondary | ICD-10-CM | POA: Insufficient documentation

## 2013-06-14 DIAGNOSIS — Z79899 Other long term (current) drug therapy: Secondary | ICD-10-CM | POA: Insufficient documentation

## 2013-06-14 DIAGNOSIS — F411 Generalized anxiety disorder: Secondary | ICD-10-CM | POA: Insufficient documentation

## 2013-06-14 DIAGNOSIS — Z3202 Encounter for pregnancy test, result negative: Secondary | ICD-10-CM | POA: Insufficient documentation

## 2013-06-14 LAB — CBC WITH DIFFERENTIAL/PLATELET
Basophils Absolute: 0 10*3/uL (ref 0.0–0.1)
Basophils Relative: 1 % (ref 0–1)
EOS PCT: 3 % (ref 0–5)
Eosinophils Absolute: 0.2 10*3/uL (ref 0.0–0.7)
HCT: 36.6 % (ref 36.0–46.0)
Hemoglobin: 11.8 g/dL — ABNORMAL LOW (ref 12.0–15.0)
LYMPHS ABS: 2.3 10*3/uL (ref 0.7–4.0)
Lymphocytes Relative: 36 % (ref 12–46)
MCH: 29.5 pg (ref 26.0–34.0)
MCHC: 32.2 g/dL (ref 30.0–36.0)
MCV: 91.5 fL (ref 78.0–100.0)
Monocytes Absolute: 0.7 10*3/uL (ref 0.1–1.0)
Monocytes Relative: 10 % (ref 3–12)
NEUTROS ABS: 3.2 10*3/uL (ref 1.7–7.7)
Neutrophils Relative %: 50 % (ref 43–77)
Platelets: 386 10*3/uL (ref 150–400)
RBC: 4 MIL/uL (ref 3.87–5.11)
RDW: 13.3 % (ref 11.5–15.5)
WBC: 6.3 10*3/uL (ref 4.0–10.5)

## 2013-06-14 LAB — COMPREHENSIVE METABOLIC PANEL
ALT: 12 U/L (ref 0–35)
AST: 18 U/L (ref 0–37)
Albumin: 3.2 g/dL — ABNORMAL LOW (ref 3.5–5.2)
Alkaline Phosphatase: 60 U/L (ref 39–117)
BUN: 7 mg/dL (ref 6–23)
CALCIUM: 8.9 mg/dL (ref 8.4–10.5)
CHLORIDE: 102 meq/L (ref 96–112)
CO2: 24 meq/L (ref 19–32)
Creatinine, Ser: 0.85 mg/dL (ref 0.50–1.10)
GFR calc non Af Amer: >90 mL/min (ref >90)
GLUCOSE: 83 mg/dL (ref 70–99)
POTASSIUM: 4.2 meq/L (ref 3.7–5.3)
SODIUM: 142 meq/L (ref 137–147)
Total Bilirubin: 0.3 mg/dL (ref 0.3–1.2)
Total Protein: 7.1 g/dL (ref 6.0–8.3)

## 2013-06-14 LAB — I-STAT TROPONIN, ED: Troponin i, poc: 0 ng/mL (ref 0.00–0.08)

## 2013-06-14 LAB — LIPASE, BLOOD: Lipase: 23 U/L (ref 11–59)

## 2013-06-14 LAB — URINALYSIS, ROUTINE W REFLEX MICROSCOPIC
BILIRUBIN URINE: NEGATIVE
GLUCOSE, UA: NEGATIVE mg/dL
Hgb urine dipstick: NEGATIVE
Ketones, ur: NEGATIVE mg/dL
Nitrite: NEGATIVE
PH: 5.5 (ref 5.0–8.0)
Protein, ur: NEGATIVE mg/dL
Specific Gravity, Urine: 1.02 (ref 1.005–1.030)
Urobilinogen, UA: 0.2 mg/dL (ref 0.0–1.0)

## 2013-06-14 LAB — URINE MICROSCOPIC-ADD ON

## 2013-06-14 LAB — PREGNANCY, URINE: Preg Test, Ur: NEGATIVE

## 2013-06-14 MED ORDER — ONDANSETRON 4 MG PO TBDP
4.0000 mg | ORAL_TABLET | Freq: Once | ORAL | Status: AC
  Administered 2013-06-14: 4 mg via ORAL
  Filled 2013-06-14: qty 1

## 2013-06-14 MED ORDER — CIPROFLOXACIN HCL 500 MG PO TABS: 500.0000 mg | ORAL_TABLET | Freq: Two times a day (BID) | ORAL | Status: AC

## 2013-06-14 MED ORDER — METRONIDAZOLE 500 MG PO TABS: 500.0000 mg | ORAL_TABLET | Freq: Three times a day (TID) | ORAL | Status: AC

## 2013-06-14 MED ORDER — ONDANSETRON 4 MG PO TBDP: 4.0000 mg | ORAL_TABLET | Freq: Three times a day (TID) | ORAL | Status: AC | PRN

## 2013-06-14 NOTE — ED Provider Notes (Signed)
CSN: [id number]     Arrival date & time 06/14/13  0451 History   First MD Initiated Contact with Patient 06/14/13 [phone]     Chief Complaint  Patient presents with  . Abdominal Pain  . Diarrhea     (Consider location/radiation/quality/duration/timing/severity/associated sxs/prior Treatment) HPI Comments: Patient presents today with a chief complaint of nausea, vomiting, and diarrhea.  He reports that symptoms have been present for the past 3 weeks.  She reports that she has been having approximately six episodes of diarrhea daily.  She states that her nausea and vomiting had improved.  She has not had any vomiting today.  She reports that she felt febrile two weeks ago, but never checked her temperature.  No fever since that time.  She reports that she had abdominal pain two weeks ago, but denies abdominal pain at this time.  She denies any blood in her stool.  She denies fever, chills, urinary symptoms, or vaginal discharge.  She denies any prior history of Inflammatory Bowel Disease.  Denies recent foreign travel.  Denies recent hospitalizations or antibiotic use.  She was seen for this at [facility] for these symptoms of 06/01/13.  At that time a CT ab/pelvis was performed, which showed possible distal colitis.  She also was found to have BV.  She reports that she has not taken any antibiotics.    Patient is a 33 y.o. female presenting with abdominal pain and diarrhea. The history is provided by the patient.  Abdominal Pain Associated symptoms: diarrhea   Diarrhea Associated symptoms: abdominal pain     Past Medical History  Diagnosis Date  . Anxiety   . Hypercholesteremia   . 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 Kidney stones    Past Surgical History  Procedure Laterality Date  . Laparoscopy N/A 04/20/2012    Procedure: LAPAROSCOPY OPERATIVE;  Surgeon: Peri Maris, MD;  Location: [facility];  Service: Gynecology;  Laterality:  N/A;  . Salpingoophorectomy Right 04/20/2012    Procedure: SALPINGO OOPHORECTOMY;  Surgeon: Peri Maris, MD;  Location[facility];  Service: Gynecology;  Laterality: Right;  . Oophorectomy Right   . Colposcopy      normal 2012,previous CIN1, VAIN1   Family History  Problem Relation Age of Onset  . Anxiety disorder Mother   . Anxiety disorder Sister   . Diabetes Maternal Grandfather    History  Substance Use Topics  . Smoking status: Never Smoker   . Smokeless tobacco: Never Used  . Alcohol Use: 7.0 oz/week    14 drink(s) per week   OB History   Grav Para Term Preterm Abortions TAB SAB Ect Mult Living   0 0 0 0 0 0 0 0 0 0      Review of Systems  Gastrointestinal: Positive for abdominal pain and diarrhea.  All other systems reviewed and are negative.     Allergies  Review of patient's allergies indicates no known allergies.  Home Medications   Prior to Admission medications   Medication Sig Start Date End Date Taking? Authorizing Provider  escitalopram (LEXAPRO) 20 MG tablet Take 1 tablet (20 mg total) by mouth daily. 04/22/13  Yes Regina Eck, CNM  Norgestimate-Ethinyl Estradiol Triphasic (TRI-SPRINTEC) 0.18/0.215/0.25 MG-35 MCG tablet Take 1 tablet by mouth daily. 04/22/13  Yes Regina Eck, CNM  ondansetron (ZOFRAN) 4 MG tablet Take 1 tablet (4 mg total) by mouth every 6 (six) hours  as needed for nausea or vomiting. 06/02/13  Yes Manya Silvas, CNM   BP 133/91  Pulse 90  Temp(Src) 98.3 F (36.8 C) (Oral)  Resp 16  Ht 5\' 6"  (1.676 m)  Wt 150 lb (68.04 kg)  BMI 24.22 kg/m2  SpO2 98%  LMP 05/22/2013 Physical Exam  Constitutional: She appears well-developed and well-nourished.  HENT:  Head: Normocephalic and atraumatic.  Mouth/Throat: Oropharynx is clear and moist.  Neck: Normal range of motion. Neck supple.  Cardiovascular: Normal rate, regular rhythm and normal heart sounds.   Pulmonary/Chest: Effort normal and breath sounds normal.  Abdominal:  Soft. Bowel sounds are normal. She exhibits no distension and no mass. There is no tenderness. There is no rebound and no guarding.  Neurological: She is alert.  Skin: Skin is warm and dry.  Psychiatric: She has a normal mood and affect.    ED Course  Procedures (including critical care time) Labs Review Labs Reviewed  CBC WITH DIFFERENTIAL - Abnormal; Notable for the following:    Hemoglobin 11.8 (*)    All other components within normal limits  COMPREHENSIVE METABOLIC PANEL - Abnormal; Notable for the following:    Albumin 3.2 (*)    All other components within normal limits  URINALYSIS, ROUTINE W REFLEX MICROSCOPIC - Abnormal; Notable for the following:    APPearance HAZY (*)    Leukocytes, UA SMALL (*)    All other components within normal limits  URINE MICROSCOPIC-ADD ON - Abnormal; Notable for the following:    Squamous Epithelial / LPF FEW (*)    Bacteria, UA FEW (*)    All other components within normal limits  URINE CULTURE  LIPASE, BLOOD  PREGNANCY, URINE  I-STAT TROPOININ, ED    Imaging Review No results found.   EKG Interpretation None      MDM   Final diagnoses:  None   Patient presenting today with a chief complaint of nausea, vomiting, and diarrhea.  Vomiting has improved, but diarrhea has been persistent for the past 3 weeks.  She denies blood in her stool.  No foreign travel.  She does not have any recent antibiotic use or hospitalizations that would put her at risk for C Diff.  C Diff ordered as well as stool cultures.  Patient unable to provide stool sample while in the ED.  Patient is afebrile.  No abdominal pain on exam.  Had a recent CT of her abdomen and pelvis on 06/01/13, which showed possible Colitis.  UA showing possible UTI.  Urine cultured.  Patient also found to have BV while at [facility] on 06/01/13, but never took antibiotic.  Will treat patient with Cipro and Flagyl, which will treat BV and UTI and also possible colitis.  Do not feel  that additional imaging is indicated at this time.  Labs unremarkable.  Feel that the patient is stable for discharge.  Patient given referral to GI if symptoms persist.  Return precautions given.    Hyman Bible, PA-C 06/14/13 1536

## 2013-06-14 NOTE — Discharge Instructions (Signed)
°Emergency Department Resource Guide °1) Find a Doctor and Pay Out of Pocket °Although you won't have to find out who is covered by your insurance plan, it is a good idea to ask around and get recommendations. You will then need to call the office and see if the doctor you have chosen will accept you as a new patient and what types of options they offer for patients who are self-pay. Some doctors offer discounts or will set up payment plans for their patients who do not have insurance, but you will need to ask so you aren't surprised when you get to your appointment. ° °2) Contact Your Local Health Department °Not all health departments have doctors that can see patients for sick visits, but many do, so it is worth a call to see if yours does. If you don't know where your local health department is, you can check in your phone book. The CDC also has a tool to help you locate your state's health department, and many state websites also have listings of all of their local health departments. ° °3) Find a Walk-in Clinic °If your illness is not likely to be very severe or complicated, you may want to try a walk in clinic. These are popping up all over the country in pharmacies, drugstores, and shopping centers. They're usually staffed by nurse practitioners or physician assistants that have been trained to treat common illnesses and complaints. They're usually fairly quick and inexpensive. However, if you have serious medical issues or chronic medical problems, these are probably not your best option. ° °No Primary Care Doctor: °- Call [facility] at  832-8000 - they can help you locate a primary care doctor that  accepts your insurance, provides certain services, etc. °- Physician Referral Service- 1-800-533-3463 ° °Chronic Pain Problems: °Organization         Address  Phone   Notes  °[facility]  (336) 297-2271 Patients need to be referred by their primary care doctor.  ° °Medication  Assistance: °Organization         Address  Phone   Notes  °[place] [facility] [address] °[place], [place] [address] (336) 641-8030 --Must be a resident of [place] °-- Must have NO insurance coverage whatsoever (no Medicaid/ Medicare, etc.) °-- The pt. MUST have a primary care doctor that directs their care regularly and follows them in the community °  °[facility]  (866) 331-1348   °[employer]  (888) 892-1162   ° °Agencies that provide inexpensive medical care: °Organization         Address  Phone   Notes  °[facility]  (336) 832-8035   °[facility]    (336) 832-7272   °[facility] [address] °[place], [place] [address] (336) 832-4777   °[facility] [address], °[place] (336) 271-4999   °[facility]    (336) 373-0678   °[facility]    (336) 272-1050   °[facility] ° [address], [place] Phone:  (336) 832-4444, Fax:  (336) 832-4440 Hours of Operation:  9 am - 6 pm, M-F.  Also accepts Medicaid/Medicare and self-pay.  °[facility] ° [address], [place] Phone: (336) 832-3150, Fax: (336) 832-3151. Hours of Operation:  8:30 am - 5:30 pm, M-F.  Also accepts Medicaid and self-pay.  °[facility] [place] [address], [place] Phone: (336) 878-6027   °[facility] [address], [place], [place] (336)723-1848, Ext. 123 Mondays & Thursdays: 7-9 AM.  First 15 patients are seen on a first come, first serve basis. °  ° °Medicaid-accepting [place] Providers: ° °Organization         Address  Phone   Notes  °[facility] [address], [place] (336) 641-2100 Also accepts self-pay patients.  °[facility] [address], [place] ° (336) 856-9996   °[facility] [address], [place]  (336) 288-8857   °[facility] [address] [place] [address], [place] (336) 299-7000   °Veita Bland [address], [place]  ° (336) 373-1557 Only accepts [place] Access Medicaid patients after they have their name applied to their card.  ° °Self-Pay (no insurance) in [place]: ° °Organization         Address  Phone   Notes  °Sickle Cell Patients, [facility] [address], [place] (336) 832-1970   °[facility] [address], [place] (336) 832-4400   °[facility] ° [address], [place] (336) 992-4800   °[facility]/Dr. Osei-Bonsu ° [address] [place] [address], [place] or [address], [place] (336) 841-8500 Phone number for both [place] and [place] locations is the same.  °[facility] [address], [place] (336) 299-0000   °[facility] [address] [place] [address], [place] or [address] (336) 852-7530 °(336) 878-2260   °[facility] [address], [place] (336) 350-1642, phone; (336) 294-5005, fax Sees patients 1st and 3rd Saturday of every month.  Must not qualify for public or private insurance (i.e. Medicaid, Medicare, [place] Health Choice, Veterans' Benefits) • Household income should be no more than 200% of the poverty level •The clinic cannot treat you if you are pregnant or think you are pregnant • Sexually transmitted diseases are not treated at the clinic.  ° ° °Dental Care: °Organization         Address  Phone  Notes  °[facility] [place] [facility] [address], [place] (336) 641-6152 Accepts children up to age 21 who are enrolled in Medicaid or [place] Health Choice; pregnant women with a Medicaid card; and children who have applied for Medicaid or [place] Health Choice, but were declined, whose parents can pay a reduced fee at time of service.  °[place]  [facility] [place]  [address], [place] (336) 641-7733 Accepts children up to age 21 who are enrolled in Medicaid or [place] Health Choice; pregnant women with a Medicaid card; and children who have applied for Medicaid or [place] Health Choice, but were declined, whose parents can pay a reduced fee at time of service.  °[facility] ° [address],  (336) 641-4533 Patients are seen by appointment only. Walk-ins are not accepted. [facility] will see patients 18 years of age and older. °Monday - Tuesday (8am-5pm) °Most Wednesdays (8:30-5pm) °$30 per visit, cash only  °[facility] ° [address], [place] (336) 641-4533 Patients are seen by appointment only. Walk-ins are not accepted. [facility] will see patients 18 years of age and older. °One   Wednesday Evening (Monthly: Volunteer Based).  $30 per visit, cash only  °[facility]  (919) 537-3737 for adults; Children under age 4, call Graduate Pediatric Dentistry at (919) [phone]. Children aged 4-14, please call (919) [phone] to request a pediatric application. ° Dental services are provided in all areas of dental care including fillings, crowns and bridges, complete and partial dentures, implants, gum treatment, root canals, and extractions. Preventive care is also provided. Treatment is provided to both adults and children. °Patients are selected via a lottery and there is often a waiting list. °  °[facility] [address], °[place] ° (336) 763-8833 [email or web address] °  °[facility] [address], [place], [place] (336)723-1848, Ext. 123 Second and Fourth Thursday of each month, opens at 6:30 AM; Clinic ends at 9 AM.  Patients are seen on a first-come first-served basis, and a limited number are seen during each clinic.  ° °[facility] ° [address], [place], [place] (336) 723-7904    Eligibility Requirements °You must have lived in [place], [place], or [place] counties for at least the last three months. °  You cannot be eligible for state or federal sponsored healthcare insurance, including Veterans Administration, Medicaid, or Medicare. °  You generally cannot be eligible for healthcare insurance through your employer.  °  How to apply: °Eligibility screenings are held every Tuesday and Wednesday afternoon from 1:00 pm until 4:00 pm. You do not need an appointment for the interview!  °[facility] [address], [place], [place] 336-631-2330   °[facility]  336-342-8273   °[facility]  336-703-3100   °[facility]  336-570-6415   ° °Behavioral Health Resources in the Community: °Intensive Outpatient Programs °Organization         Address  Phone  Notes  °[facility] [address], [place], [place] 336-878-6098   °[facility] [address], [place], [place] 336-832-9800   °[facility] [address], [place], [place] ° 336-882-2125   °[facility] [address],  °[place], [place] 1-800-853-5163 or 336-641-4981   °Substance Abuse Resources °Organization         Address  Phone  Notes  °[facility]  336-882-2125   °[facility]  336-784-9470   °[facility]  336-285-9073   °[facility]  336-845-3988   °Residential & Outpatient Substance Abuse Program  1-800-659-3381   °Psychological Services °Organization         Address  Phone  Notes  °[facility]  336- 832-9600   °[facility]  336- 378-7881   °[facility] [address], [place] 1-800-853-5163 or 336-641-4981   ° °Mobile Crisis Teams °Organization         Address  Phone  Notes  °[facility], Mobile Crisis Care Unit  1-877-626-1772   °Assertive °[facility] ° [address]  [place], [place] 336-834-9664   °Sharon DeEsch [address] °[place] 336-554-5454   ° °Self-Help/Support Groups °Organization         Address  Phone             Notes  °[facility]  - variety of support groups  336- 373-1402 Call for more information  °Narcotics Anonymous (NA), [facility] [address], °[place]  2 meetings at this location  ° °  Residential Treatment Programs °Organization         Address  Phone  Notes  °[facility] [address],    °[place]  1-866-801-8205   °[facility] ° [address], [place], [place] 704-293-8524   °[facility] [address], [place] 336-845-3988 Admissions: 8am-3pm M-F  °[facility] [address]    °[place], [place] 336-841-1104   °[facility] [address], [place], [place] 336-379-7146   °[facility] [address]  °[place], [place] 336-285-9073   °[facility] Intensive Outpatient [address], [place], [place] 336-852-3033   °[facility] [address]  °[place], [place] 1-877-615-2722 or 336-784-9470   °[facility] ([facility] [address] [place], [place] 336-227-7417 Accepts Medicaid  °[facility] [address]  ° [place] 1-800-659-3381 Substance Abuse/Addiction Treatment  ° °[place] Behavioral Health Resources °Organization         Address  Phone  Notes  °[facility]  (888) 581-9988   °Julie Brannon, PhD [address] [place], [place]   (336) 349-5553 or (336) 951-0000   °[facility]   [address] °[place], [place] (336) 349-4454   °[facility] [address], [place], [place] (336) 342-8316 Insurance/Medicaid/sponsorship through [facility]  °[facility] [address]                                    [place], [place] (336) 342-8316 Therapy/tele-psych/case    °[facility] [address]  ° [place], [place] (336) 349-2233    °Dr. Arfeen  (336) 349-4544   °[facility] [place]  [employer] [place] [facility] 1) [address],  °2) [address], [place] °3)  [address], [place] (336) 349-3220 °(336) 342-7768 ° °(336) 342-8140   °[place] [facility] (336) 342-1394 or (336) 342-3537 (After Hours)    ° ° °

## 2013-06-14 NOTE — ED Notes (Signed)
Pt states that she experienced extreme diarrhea/vomitting/night sweats and abd pain 3 weeks ago. States that all of that has gotten much better. States that she now is having trouble eating, states when she eats she experiences abd pain and vomiting.

## 2013-06-15 LAB — URINE CULTURE
CULTURE: NO GROWTH
Colony Count: NO GROWTH

## 2013-06-15 NOTE — ED Provider Notes (Signed)
Medical screening examination/treatment/procedure(s) were performed by non-physician practitioner and as supervising physician I was immediately available for consultation/collaboration.   EKG Interpretation None       Kalman Drape, MD 06/15/13 1406

## 2013-06-23 NOTE — Telephone Encounter (Signed)
I have attempted to contact this patient by phone with the following results: left message to return my call on answering machine - [phone] [facility]).

## 2013-07-04 NOTE — Telephone Encounter (Signed)
Yes will close encounter

## 2013-07-04 NOTE — Telephone Encounter (Signed)
Debbi we have been unable to reach patient. Okay to close encounter?

## 2014-04-24 ENCOUNTER — Other Ambulatory Visit: Payer: Self-pay | Admitting: Certified Nurse Midwife

## 2014-04-25 NOTE — Telephone Encounter (Signed)
Medication refill request: Tri-Sprintec  Last AEX:  04/22/13 DL Next AEX: not scheduled  Last MMG (if hormonal medication request): none Refill authorized: 04/22/13 #1pack/ 12R. Today #1pack /0R?  Attempted to call patient. No answer. Unable to leave voicemail.

## 2014-04-26 ENCOUNTER — Other Ambulatory Visit: Payer: Self-pay | Admitting: Certified Nurse Midwife

## 2014-04-26 NOTE — Telephone Encounter (Signed)
Medication refill request: OCP Last AEX:  04/22/13 DL Next AEX: none Last MMG (if hormonal medication request): none Refill authorized: 04/25/14 #28/0R to [facility]  Rx 1 month sent yesterday to pharmacy.  Patient notified.

## 2014-04-26 NOTE — Telephone Encounter (Signed)
Patient has moved away and is asking for a refill of birth control. Patient is looking for a new GYN in [place].Confirmed pharmacy on file.

## 2014-05-21 ENCOUNTER — Other Ambulatory Visit: Payer: Self-pay | Admitting: Certified Nurse Midwife

## 2014-05-22 NOTE — Telephone Encounter (Signed)
Medication refill request: Lexapro  Last AEX:  04/22/13 DL  Next AEX: Not schedule  Last MMG (if hormonal medication request): none Refill authorized: 04/25/14 #28tabs/ 0R. Patient moved and was notified we only gave her one refill.

## 2014-05-31 ENCOUNTER — Other Ambulatory Visit: Payer: Self-pay | Admitting: Certified Nurse Midwife

## 2014-06-01 NOTE — Telephone Encounter (Signed)
Left Message To Call Back  

## 2014-06-01 NOTE — Telephone Encounter (Signed)
Pt returned call to [facility]

## 2014-06-01 NOTE — Telephone Encounter (Signed)
Medication refill request: Lexapro 20 mg  Last AEX:  04/22/13 with DL Next AEX: ? Last MMG (if hormonal medication request): n/a Refill authorized: ?  Left Message To Call Back

## 2014-06-02 MED ORDER — NORGESTIM-ETH ESTRAD TRIPHASIC 0.18/0.215/0.25 MG-35 MCG PO TABS: 1.0000 | ORAL_TABLET | Freq: Every day | ORAL | Status: AC

## 2014-06-02 NOTE — Telephone Encounter (Signed)
LM on patient's vm that rx has been sent to pharmacy.

## 2014-06-02 NOTE — Telephone Encounter (Signed)
Called and s/w patient she has moved to [place] and is going to see a new physician in a few weeks. Patient is asking for one month of both the Lexapro and her Tr-Sprintec to last her until her new appointment with new physician.   Ms.Lauren Moran, please advise okay to send in one month for each?

## 2014-08-18 ENCOUNTER — Telehealth: Payer: Self-pay | Admitting: *Deleted

## 2014-08-18 NOTE — Telephone Encounter (Signed)
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  Please call pt to schedule AEX.  Thank you.

## 2014-08-18 NOTE — Telephone Encounter (Signed)
Left Message To Call Back  

## 2014-08-23 NOTE — Telephone Encounter (Signed)
Left Message To Call Back  

## 2014-08-25 ENCOUNTER — Encounter: Payer: Self-pay | Admitting: *Deleted

## 2014-08-25 NOTE — Telephone Encounter (Signed)
Called patient x2 no callback

## 2014-08-25 NOTE — Telephone Encounter (Signed)
Letter signed.  Ok to remove from recall.

## 2014-08-25 NOTE — Telephone Encounter (Signed)
Pt has not returned call or scheduled AEX.  Letter created.  Please advise recall.

## 2014-08-28 ENCOUNTER — Encounter: Payer: Self-pay | Admitting: *Deleted

## 2014-08-28 NOTE — Telephone Encounter (Signed)
Letter mailed and marked as sent.  Pt removed from current recall.  Closing encounter.

## 2015-06-20 ENCOUNTER — Encounter: Payer: Self-pay | Admitting: Certified Nurse Midwife

## 2015-07-28 IMAGING — CT CT ABD-PELV W/ CM
1 of 2 series · 15 of 32 positions shown, 19 images · IV contrast (OMNIPAQUE)
Comparison: 03/23/2013

CLINICAL DATA: RIGHT lower quadrant pain for 7 days question
appendicitis, history kidney stones

EXAM:
CT ABDOMEN AND PELVIS WITH CONTRAST
TECHNIQUE: Multidetector CT imaging of the abdomen and pelvis was performed
using the standard protocol following bolus administration of
intravenous contrast.
CONTRAST:  100mL OMNIPAQUE IOHEXOL 300 MG/ML SOLN IV. Dilute oral
contrast.

[Series 2: routine abdomen/pelvis with · axial · 0.73mm/px · z∈[-419,+16]mm · 15 of 95 slices shown, 19 images]
[im 4/95  soft-tissue]
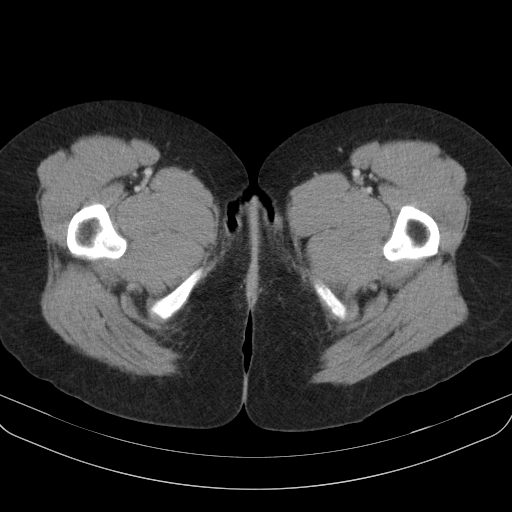
[im 4/95  bone]
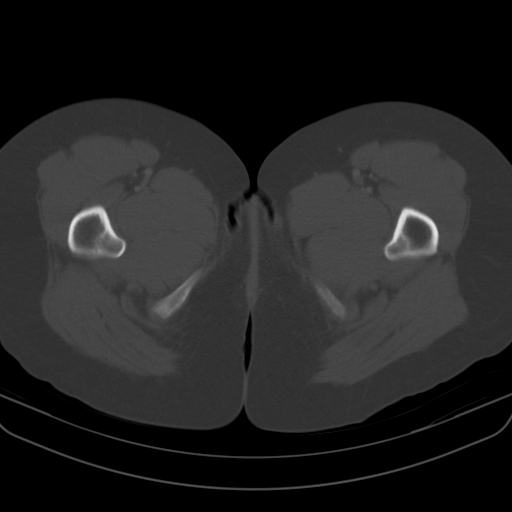
[im 12/95  soft-tissue]
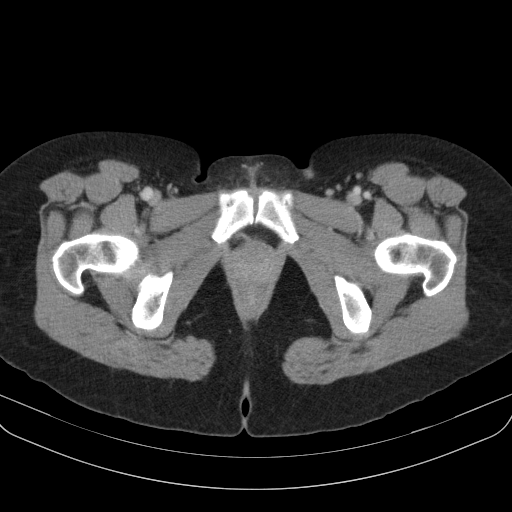
[im 19/95  soft-tissue]
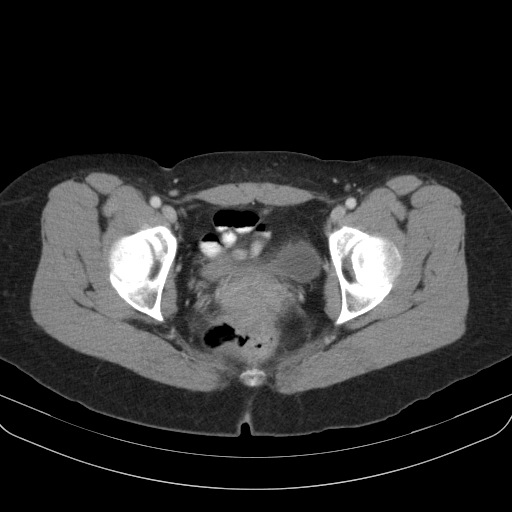
[im 27/95  soft-tissue]
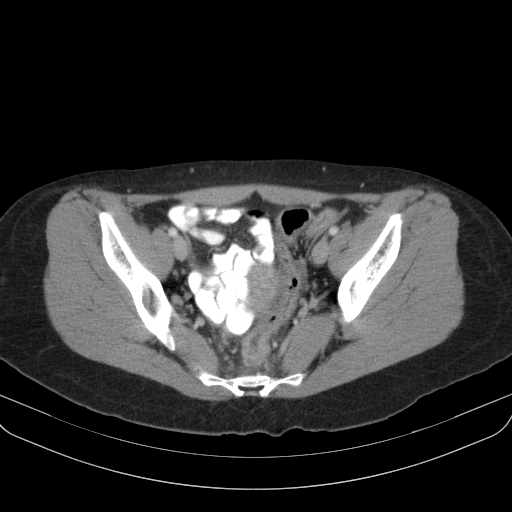
[im 34/95  soft-tissue]
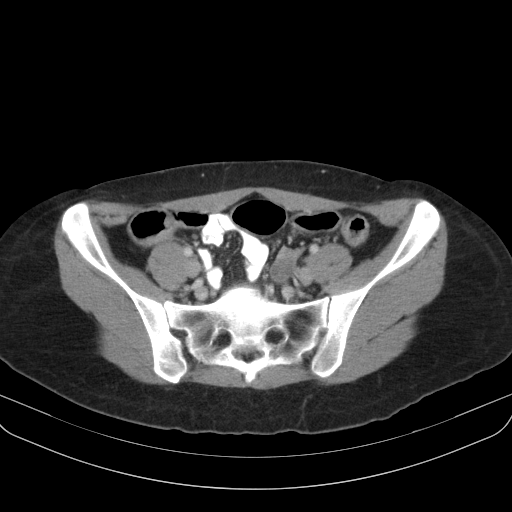
[im 42/95  soft-tissue]
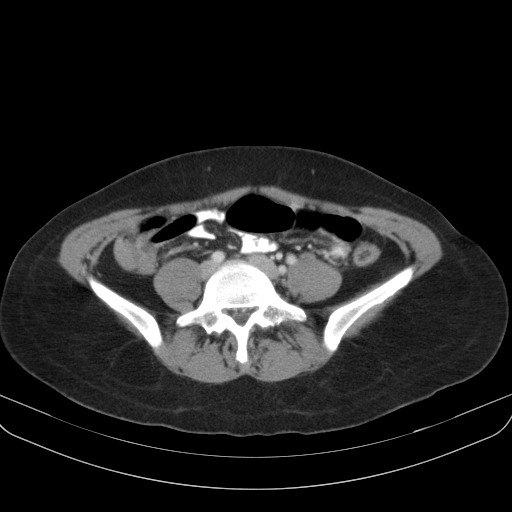
[im 49/95  soft-tissue]
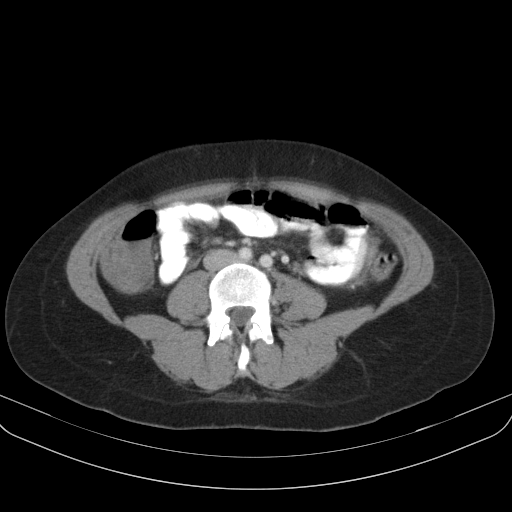
[im 53/95  soft-tissue]
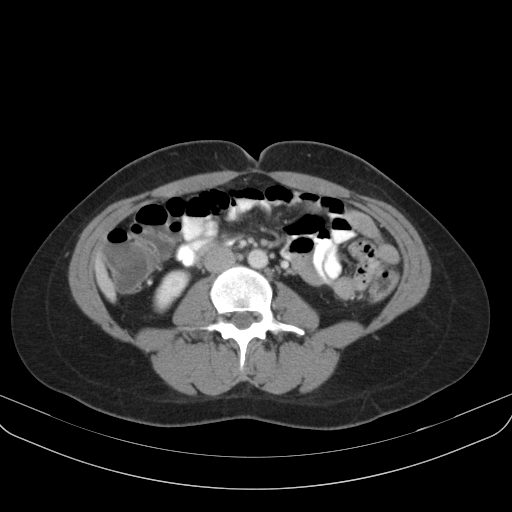
[im 61/95  soft-tissue]
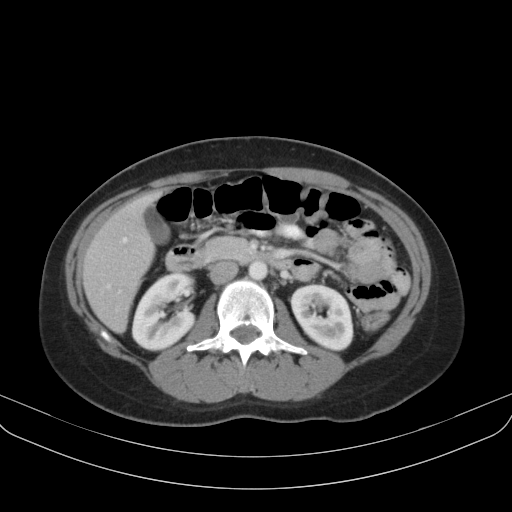
[im 61/95  bone]
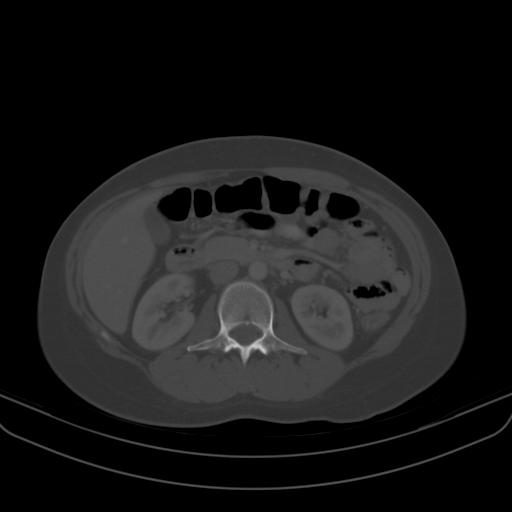
[im 68/95  soft-tissue]
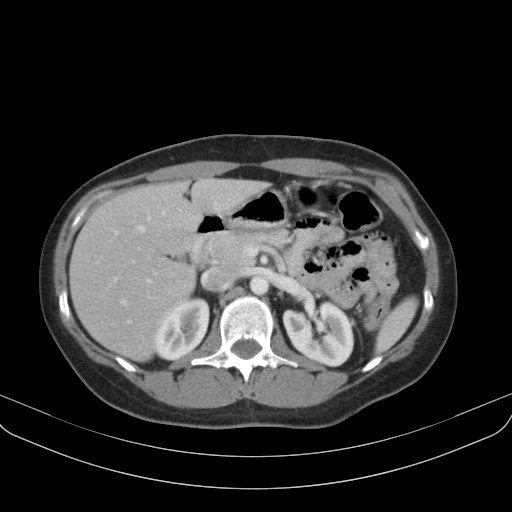
[im 76/95  soft-tissue]
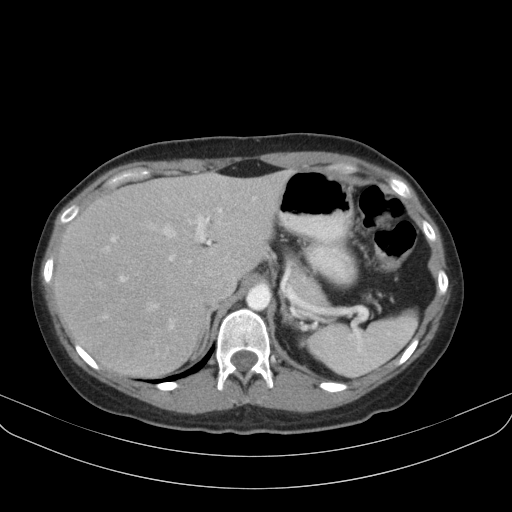
[im 79/95  lung]
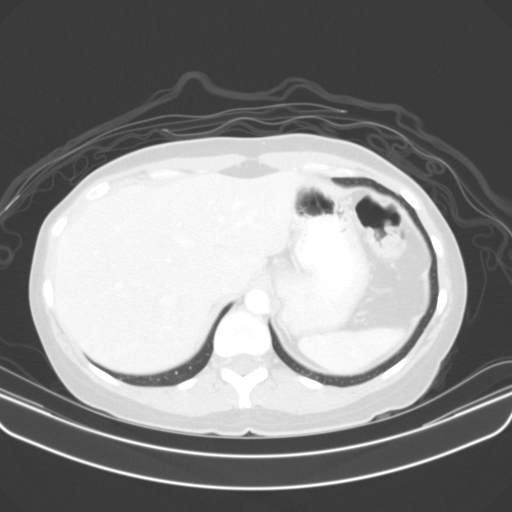
[im 83/95  soft-tissue]
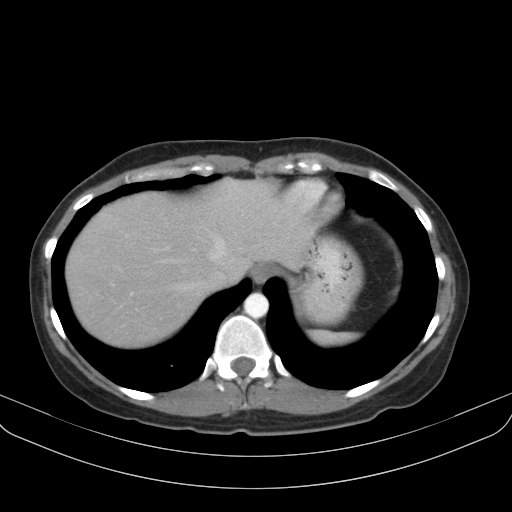
[im 83/95  lung]
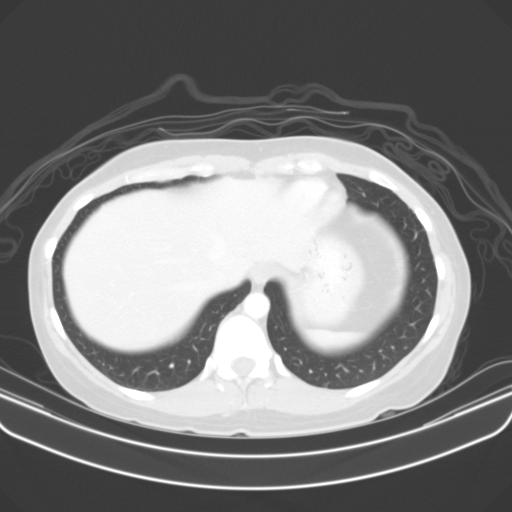
[im 87/95  lung]
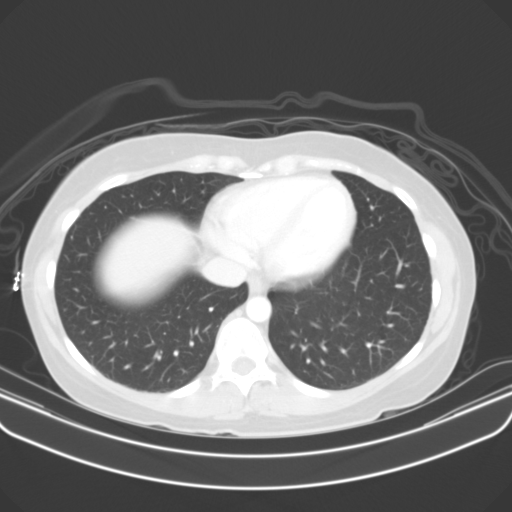
[im 91/95  soft-tissue]
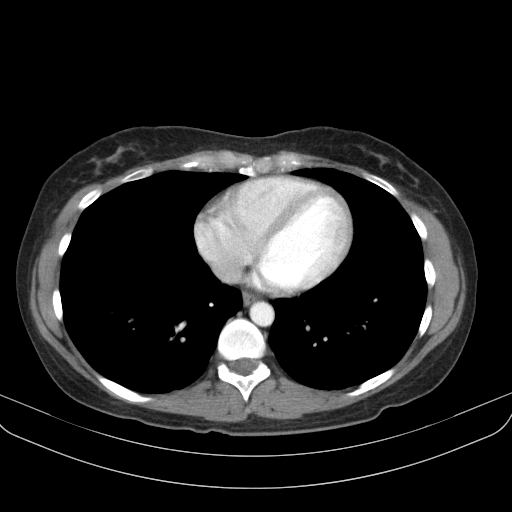
[im 91/95  lung]
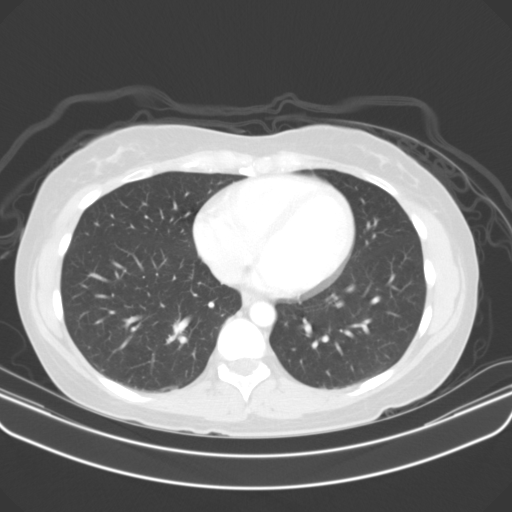

[15 of 32 positions shown; findings below may reference images not displayed]

FINDINGS: Lung bases clear.

Liver, spleen, pancreas, kidneys, and adrenal glands normal
appearance.

Unremarkable bladder, ureters, uterus, the and LEFT ovary.

RIGHT ovary surgically absent.

Normal appendix.

Stomach and small bowel loops normal appearance.

Colon is unopacified and under distended with question slightly
increased mucosal/wall enhancement distally.

Unable to exclude wall thickening/colitis at the distal descending
and sigmoid colon.

No mass, adenopathy, free fluid, or free air.

Bones unremarkable.
IMPRESSION: Normal appendix.

Questionable wall thickening and increased mucosal enhancement of
the distal descending and sigmoid colon though these are under
distended and unopacified, cannot exclude distal colitis.

## 2015-10-22 ENCOUNTER — Encounter: Payer: Self-pay | Admitting: Certified Nurse Midwife

## 2015-12-03 ENCOUNTER — Encounter: Payer: Self-pay | Admitting: Obstetrics and Gynecology

## 2022-07-11 ENCOUNTER — Emergency Department: Payer: PRIVATE HEALTH INSURANCE

## 2022-07-11 ENCOUNTER — Other Ambulatory Visit: Payer: Self-pay

## 2022-07-11 ENCOUNTER — Emergency Department: Payer: PRIVATE HEALTH INSURANCE | Admitting: Radiology

## 2022-07-11 ENCOUNTER — Encounter: Payer: Self-pay | Admitting: Obstetrics and Gynecology

## 2022-07-11 ENCOUNTER — Emergency Department
Admission: EM | Admit: 2022-07-11 | Discharge: 2022-07-11 | Disposition: A | Discharge status: Home / Self Care | Payer: PRIVATE HEALTH INSURANCE | Source: Ambulatory Visit | Attending: Student in an Organized Health Care Education/Training Program | Admitting: Student in an Organized Health Care Education/Training Program

## 2022-07-11 ENCOUNTER — Ambulatory Visit: Payer: PRIVATE HEALTH INSURANCE | Admitting: Obstetrics and Gynecology

## 2022-07-11 VITALS — BP 118/82 | HR 80 | Temp 96.9°F | Resp 18

## 2022-07-11 DIAGNOSIS — R000 Tachycardia, unspecified: Secondary | ICD-10-CM | POA: Insufficient documentation

## 2022-07-11 DIAGNOSIS — Z789 Other specified health status: Secondary | ICD-10-CM

## 2022-07-11 DIAGNOSIS — R0602 Shortness of breath: Secondary | ICD-10-CM | POA: Insufficient documentation

## 2022-07-11 DIAGNOSIS — Z1152 Encounter for screening for COVID-19: Secondary | ICD-10-CM | POA: Insufficient documentation

## 2022-07-11 DIAGNOSIS — R079 Chest pain, unspecified: Secondary | ICD-10-CM

## 2022-07-11 DIAGNOSIS — R42 Dizziness and giddiness: Secondary | ICD-10-CM | POA: Insufficient documentation

## 2022-07-11 DIAGNOSIS — R0789 Other chest pain: Secondary | ICD-10-CM | POA: Insufficient documentation

## 2022-07-11 DIAGNOSIS — F419 Anxiety disorder, unspecified: Secondary | ICD-10-CM | POA: Insufficient documentation

## 2022-07-11 DIAGNOSIS — Z1159 Encounter for screening for other viral diseases: Secondary | ICD-10-CM | POA: Insufficient documentation

## 2022-07-11 LAB — COMPREHENSIVE METABOLIC PANEL
ALT: 27 U/L (ref 0–35)
AST: 26 U/L (ref 0–35)
Albumin: 4 g/dL (ref 3.5–5.2)
Alk Phos: 77 U/L (ref 35–105)
Anion Gap: 12 (ref 7–16)
Bilirubin,Total: 0.6 mg/dL (ref 0.0–1.2)
CO2: 23 mmol/L (ref 20–28)
Calcium: 9.2 mg/dL (ref 8.8–10.2)
Chloride: 105 mmol/L (ref 96–108)
Creatinine: 0.91 mg/dL (ref 0.51–0.95)
Glucose: 95 mg/dL (ref 60–99)
Lab: 9 mg/dL (ref 6–20)
Potassium: 4.3 mmol/L (ref 3.3–4.6)
Sodium: 140 mmol/L (ref 133–145)
Total Protein: 7 g/dL (ref 6.3–7.7)
eGFR BY CREAT: 81 *

## 2022-07-11 LAB — CBC AND DIFFERENTIAL
Baso # K/uL: 0 10*3/uL (ref 0.0–0.2)
Eos # K/uL: 0.2 10*3/uL (ref 0.0–0.5)
Hematocrit: 44 % (ref 34–49)
Hemoglobin: 14.6 g/dL (ref 11.2–16.0)
IMM Granulocytes #: 0 10*3/uL (ref 0.0–0.0)
IMM Granulocytes: 0.2 %
Lymph # K/uL: 2.3 10*3/uL (ref 1.0–5.0)
MCV: 95 fL (ref 75–100)
Mono # K/uL: 0.6 10*3/uL (ref 0.1–1.0)
Neut # K/uL: 3.5 10*3/uL (ref 1.5–6.5)
Nucl RBC # K/uL: 0 10*3/uL (ref 0.0–0.0)
Nucl RBC %: 0 /100 WBC (ref 0.0–0.2)
Platelets: 335 10*3/uL (ref 150–450)
RBC: 4.6 MIL/uL (ref 4.0–5.5)
RDW: 12.7 % (ref 0.0–15.0)
Seg Neut %: 53.5 %
WBC: 6.6 10*3/uL (ref 3.5–11.0)

## 2022-07-11 LAB — POCT GLUCOSE: Glucose POCT: 118 mg/dL — ABNORMAL HIGH (ref 60–99)

## 2022-07-11 LAB — TROPONIN T 3 HR W/ DELTA HIGH SENSITIVITY (IP/ED ONLY): TROP T 3 HR High Sensitivity: <6 ng/L (ref 0–14)

## 2022-07-11 LAB — TROPONIN T 1 HR W/ DELTA HIGH SENSITIVITY: TROP T 1 HR High Sensitivity: <6 ng/L (ref 0–11)

## 2022-07-11 LAB — NT-PRO BNP: NT-pro BNP: 154 pg/mL (ref 0–450)

## 2022-07-11 LAB — TROPONIN T 0 HR HIGH SENSITIVITY (IP/ED ONLY): TROP T 0 HR High Sensitivity: <6 ng/L (ref 0–11)

## 2022-07-11 LAB — HOLD SST

## 2022-07-11 LAB — COVID/INFLUENZA A & B/RSV NAAT (PCR)
COVID-19 NAAT (PCR): NEGATIVE
Influenza A NAAT (PCR): NEGATIVE
Influenza B NAAT (PCR): NEGATIVE
RSV NAAT (PCR): NEGATIVE

## 2022-07-11 LAB — D-DIMER, QUANTITATIVE: D-Dimer: 0.27 ug/mL FEU (ref 0.00–0.50)

## 2022-07-11 LAB — PERFORMING LAB

## 2022-07-11 LAB — PREGNANCY TEST, SERUM: Preg,Serum: NEGATIVE

## 2022-07-11 MED ORDER — HYDROXYZINE HCL 10 MG PO TABS *I*
10.0000 mg | ORAL_TABLET | Freq: Once | ORAL | Status: AC
Start: 2022-07-11 — End: 2022-07-11
  Administered 2022-07-11: 10 mg via ORAL
  Filled 2022-07-11: qty 1

## 2022-07-11 MED ORDER — HYDROXYZINE HCL 10 MG PO TABS *I*
10.0000 mg | ORAL_TABLET | Freq: Three times a day (TID) | ORAL | 0 refills | Status: AC | PRN
Start: 2022-07-11 — End: 2022-07-18

## 2022-07-11 MED ORDER — MECLIZINE HCL 12.5 MG PO TABS *I*
25.0000 mg | ORAL_TABLET | Freq: Once | ORAL | Status: AC
Start: 2022-07-11 — End: 2022-07-11
  Administered 2022-07-11: 25 mg via ORAL
  Filled 2022-07-11: qty 2

## 2022-07-11 MED ORDER — LACTATED RINGERS IV BOLUS *I*
1000.0000 mL | Freq: Once | INTRAVENOUS | Status: AC
Start: 2022-07-11 — End: 2022-07-11
  Administered 2022-07-11: 1000 mL via INTRAVENOUS

## 2022-07-11 MED ORDER — MECLIZINE HCL 12.5 MG PO TABS *I*
12.5000 mg | ORAL_TABLET | Freq: Three times a day (TID) | ORAL | 0 refills | Status: AC | PRN
Start: 2022-07-11 — End: 2022-07-18

## 2022-07-11 MED ORDER — IOHEXOL 350 MG/ML (OMNIPAQUE) IV SOLN 500ML BOTTLE *I*
1.0000 mL | Freq: Once | INTRAVENOUS | Status: AC
Start: 2022-07-11 — End: 2022-07-11
  Administered 2022-07-11: 70 mL via INTRAVENOUS

## 2022-07-11 NOTE — ED Triage Notes (Signed)
Pt c/o dizziness since Tuesday, recent drive from [place].  Pt reports anxiety and panic attack on the trip.  Pt referred here from Urgent care.

## 2022-07-11 NOTE — Discharge Instructions (Signed)
You were evaluated in the ER for vertigo.  Luckily, CT imaging did not show any acute abnormalities stroke, mass, or bleed.  It is unclear exactly what caused your symptoms.  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  Is also possible but stress contributes to your symptoms.  You are sent a couple of prescriptions to help with your vertigo.  You can take the prescribed meclizine every 6-8 hours as needed if you feel dizzy.  You can also take the prescribed Atarax every 6-8 hours as needed if you feel anxious.  Please try to limit stress and stay well-hydrated.    Please establish with a primary care doctor in the area and see them in approximately 1 week if possible.  Talk to them about your symptoms and stress to see if you need to be restarted on long-term medication.  Please return to the ER if you have worsening vertigo and cannot walk, fall and suffer injury, develop persistent chest pain, trouble breathing, infectious symptoms, or develop other medical concerns.

## 2022-07-11 NOTE — First Provider Contact (Signed)
ED First Provider Contact Note    Initial provider evaluation performed by me on 7/5 at 1459    Vital signs reviewed.    Assessment: This is a 42 year old female presenting to the emergency department lightheadedness.  Patient lives in [place] and recently traveled here for vacation.  She been feeling lightheaded, intermittently dizzy and herself since coming to [place].  She has felt some intermittent chest tightness/anxiety as well.  She went to local urgent care for evaluation and was sent to the ER because she was having difficulty with finger-nose testing.  She has had symptoms for at least 3 days.    Physical exam: Heart rate regular, grossly neuro intact, does appear uncomfortable.    Orders placed:  EKG, LABS, and XRAYS     Patient requires further evaluation.     Antonieta Pert, NP, 07/11/2022, 2:59 PM     Antonieta Pert, NP  07/11/22 [phone]

## 2022-07-11 NOTE — ED Provider Notes (Signed)
History     Chief Complaint   Patient presents with    Lightheadedness (Dizziness)     Jordan Ortiz is a 42 y.o. female with PMH of anxiety who presents to the ER with dizziness.  She states that over the last 3 days she has been having vertigo.  At first it was intermittent.  She first experienced it while driving to [place] from [place].  She states that while feeling vertigo during driving she had a panic attack with chest tightness and shortness of breath.  The feelings subsided at that time, but have been recurrent since.  Today the vertigo was more severe and persistent which prompted urgent care visit that recommended she come to the ER.  For being in the waiting room for several hours she started to look unwell and nurses brought her back and called a stroke alert which prompted me coming to evaluate her.  She states that while sitting down she started to have facial numbness.  She noted to have blockage in her skin which she states she gets when anxious.  She also states that her speech feels delayed and is harder to find words.  She denies recent head trauma or injury.  She denies any previous stroke.  She denies any other known history apart from anxiety.  She has not experienced vertigo or anything like this before.  She denies change in vision, headache, neck pain, extremity pain or weakness, or recent illness.  She denies current chest pain, dyspnea, or palpitations.  She denies hematuria, dysuria, diarrhea, constipation, bloody stools or chance of pregnancy.  She has been eating and drinking normally.  She does drink a bottle of wine nightly last drink was last night.  She has never had alcohol withdrawal or seizures.  She denies any illegal drug use.  She denies any increased stressors in her life apart from moving back to [place] for part of the year.      History provided by:  Patient        Medical/Surgical/Family History     Past Medical History:   Diagnosis Date    Anxiety         There  is no problem list on file for this patient.           Past Surgical History:   Procedure Laterality Date    OVARY REMOVAL Right           Social History     Tobacco Use    Smoking status: Never    Smokeless tobacco: Never   Vaping Use    Vaping status: Never Used   Substance Use Topics    Alcohol use: Yes     Comment: 1-750 ml a day    Drug use: Never             Review of Systems    Physical Exam     Triage Vitals  Triage Start: Start, (07/11/22 1452)  First Recorded BP: (!) 144/109, Resp: 20, Temp: 36.7 C (98.1 F), Temp src: TEMPORAL Oxygen Therapy SpO2: 100 %, Heart Rate: 70, (07/11/22 1400)  .      Physical Exam  Vitals and nursing note reviewed.   Constitutional:       General: She is in acute distress (Anxious, blotchiness of facial skin, mildly delayed speech).      Appearance: She is not diaphoretic.   HENT:      Head: Normocephalic and atraumatic.  Nose: Nose normal. No congestion.      Mouth/Throat:      Mouth: Mucous membranes are moist.      Pharynx: Oropharynx is clear.   Eyes:      General: No visual field deficit.     Extraocular Movements: Extraocular movements intact.      Conjunctiva/sclera: Conjunctivae normal.   Cardiovascular:      Rate and Rhythm: Regular rhythm. Tachycardia present.      Pulses: Normal pulses.      Heart sounds: Normal heart sounds.   Pulmonary:      Effort: Pulmonary effort is normal.      Breath sounds: Normal breath sounds.   Abdominal:      General: Abdomen is flat.      Palpations: Abdomen is soft.      Tenderness: There is no abdominal tenderness. There is no guarding.   Musculoskeletal:      Cervical back: Normal range of motion and neck supple. No tenderness.   Skin:     General: Skin is warm and dry.      Capillary Refill: Capillary refill takes less than 2 seconds.   Neurological:      General: No focal deficit present.      Mental Status: She is alert and oriented to person, place, and time.      GCS: GCS eye subscore is 4. GCS verbal subscore is 5. GCS  motor subscore is 6.      Cranial Nerves: Cranial nerves 2-12 are intact. No cranial nerve deficit, dysarthria or facial asymmetry.      Sensory: Sensation is intact. No sensory deficit.      Motor: Motor function is intact. No weakness, tremor, abnormal muscle tone or pronator drift.      Coordination: Coordination is intact. Finger-Nose-Finger Test and Heel to [place] Test normal.      Gait: Gait is intact. Gait normal.   Psychiatric:         Attention and Perception: Attention normal.         Mood and Affect: Mood is anxious.         Speech: Speech is delayed (Mild).         Behavior: Behavior normal.         Medical Decision Making   Patient seen by me on:  07/11/2022    Assessment:  Jordan Ortiz is a 42 y.o. female with PMH of anxiety who presents to the ER with vertigo for 3 days that has become more severe and persistent.  Stroke alert was called out of triage and upon my initial evaluation has NIH stroke scale of 1 only for mildly delayed and fragmented speech.  She has no other focal neurological deficits.  I believe her presentation duration is possibly more due to panic attack than acute intracranial abnormality, but given persistent vertigo that is worsening, will still obtain CT angiograms and CT head to assess for any signs of posterior stroke.  Will obtain screening lab work and give IV fluids and medication to treat peripheral vertigo as well.  As she had some chest tightness and shortness of breath with long car drive, will obtain D-dimer to rule stratify for blood clot as she was tachycardic in triage, but I believe this more secondary to anxiety.  Will treat as indicated by workup.      Differential diagnosis:  As above, central versus peripheral vertigo, posterior stroke, BPPV, anxiety, panic attack, electrolyte derangement, dehydration, PE  Plan:  Orders Placed This Encounter      COVID/Influenza A & B/RSV NAAT (PCR)      *Chest standard frontal and lateral views      CT angio neck carotid for  stroke      CT angio head for stroke      CT head without contrast for stroke      CBC and differential      Comprehensive metabolic panel      Pregnancy Test, Serum      Hold blue      Troponin T 0 HR High Sensitivity      Troponin T 1 HR W/ Delta High Sensitivity      Troponin T 3 HR W/ Delta High Sensitivity      NT-pro BNP      Hold SST      D-dimer, quantitative      D-dimer, quantitative      Performing Lab      Continuous telemetry, non-protocol      Continuous telemetry, non-protocol      Initiate COVID precautions      Initiate droplet isolation      EKG 12 lead (initial)      EKG: follow up      EKG 12 lead (initial)      lactated ringers bolus 1,000 mL      iohexol (OMNIPAQUE) injection ( multi-use bottle) 1-150 mL      meclizine (ANTIVERT) tablet 25 mg      hydrOXYzine HCl (ATARAX) tablet 10 mg      meclizine (ANTIVERT) 12.5 mg tablet      hydrOXYzine HCl (ATARAX) 10 mg tablet        EKG Interpretation:  Tracing reviewed by myself, normal sinus rhythm, no ischemic changes    ED Course and Disposition:  The patient's workup overall returned reassuring.  CT angio of head and neck did not show acute abnormality.  Lab work is all reassuring including cardiac enzymes obtained out of triage.  D-dimer is negative.  Patient felt better after receiving fluids, meclizine, and Atarax.  She did request prescriptions for these things and was encouraged to follow-up with the PCP in the area.  She currently does not have one was given a list of PCPs to follow-up with.  She is encouraged to stay well-hydrated and given appropriate return cautions prior to discharge.         ED Course as of 07/11/22 2057   Caleen Essex Jul 11, 2022   1941 CT head without contrast for stroke  No CT evidence of an acute intracranial abnormality.   1942 TROP T 0 HR High Sensitivity: <6   1942 NT-pro BNP: 154   2016 CT angio neck carotid for stroke  Patent anterior and posterior circulations of the brain, without large vessel occlusion or  aneurysm.     Patent carotid and vertebral arteries of the neck, without dissection. No significant stenosis by NASCET criteria.        2049 TROP T 3 HR High Sensitivity: <6   2049 D-Dimer: 0.27       Lorne Skeens, DO            Bonney Leitz [place], [place]  07/11/22 2105

## 2022-07-11 NOTE — UC Provider Note (Signed)
History     Chief Complaint   Patient presents with    Vertigo (Dizziness)     Three day drive from [place] to [place]. Arrived Monday night. Did suffer a panic attack during the trip when they switched drivers. But was able to resume driving. Woke up Tuesday and felt like she was on a ship. Very dizzy. Mild nausea. Denies she could be pregnant.        Vertigo (Dizziness)  Associated symptoms: no chest pain, no headaches, no palpitations, no shortness of breath and no weakness    42 year old female presents with dizziness this has been ongoing for the last several days.  Reports that she just arrived in [place] after driving from [place].  Reports that during the road trip she did experience a panic attack at 1 point that involves shortness of breath, chest pain, feeling as though she was about to die but states that those symptoms all resolved without evaluation or treatment.  Reports that the dizziness started gradually about 3 days ago and has been gradually worsening.  Reports she feels like she is spinning or is "on a boat".  Is wondering if it is related to the altitude change.  Denies nausea, vomiting, or syncope.  Has not experienced vertigo in the past.  Denies history of migraines.    Medical/Surgical/Family History     Past Medical History:   Diagnosis Date    Anxiety         There is no problem list on file for this patient.           Past Surgical History:   Procedure Laterality Date    OVARY REMOVAL Right      History reviewed. No pertinent family history.       Social History     Tobacco Use    Smoking status: Never    Smokeless tobacco: Never   Vaping Use    Vaping status: Never Used   Substance Use Topics    Alcohol use: Yes     Comment: 1-750 ml a day    Drug use: Never     Living Situation       Questions Responses    Patient lives with     Homeless     Caregiver for other family member     External Services     Employment     Domestic Violence Risk                   Review of Systems   Review of  Systems   Constitutional:  Positive for diaphoresis and fatigue.   Respiratory:  Negative for chest tightness, shortness of breath and wheezing.    Cardiovascular:  Negative for chest pain and palpitations.   Neurological:  Positive for dizziness and light-headedness. Negative for seizures, syncope, weakness, numbness and headaches.   Psychiatric/Behavioral:  Negative for confusion.        Physical Exam   Vitals     First Recorded BP: 124/82, Resp: 18, Temp: 36.1 C (96.9 F) Oxygen Therapy SpO2: 99 %, Heart Rate: 70, (07/11/22 1326)  .      Physical Exam  Vitals and nursing note reviewed.   Constitutional:       Appearance: She is ill-appearing. She is not diaphoretic.      Comments: Anxious and in mild distress   HENT:      Head: Normocephalic and atraumatic.      Right Ear: Tympanic membrane, ear  canal and external ear normal. No decreased hearing noted. No middle ear effusion. There is no impacted cerumen. Tympanic membrane is not injected, erythematous, retracted or bulging.      Left Ear: Tympanic membrane, ear canal and external ear normal. No decreased hearing noted.  No middle ear effusion. There is no impacted cerumen. Tympanic membrane is not injected, erythematous, retracted or bulging.   Eyes:      General: Vision grossly intact. Gaze aligned appropriately. No visual field deficit.     Extraocular Movements: Extraocular movements intact.      Visual Fields: Right eye visual fields normal and left eye visual fields normal.      Comments: EOM testing significantly worsened dizziness   Cardiovascular:      Rate and Rhythm: Normal rate and regular rhythm.      Pulses: Normal pulses.           Radial pulses are 2+ on the right side and 2+ on the left side.        Posterior tibial pulses are 2+ on the right side and 2+ on the left side.      Heart sounds: Normal heart sounds.      Comments: No cyanosis or decreased perfusion noted  Pulmonary:      Effort: Pulmonary effort is normal. No respiratory distress.       Breath sounds: Normal breath sounds.   Musculoskeletal:      Cervical back: Full passive range of motion without pain, normal range of motion and neck supple.      Right lower leg: No edema.      Left lower leg: No edema.      Comments: Normal ROM and strength in all extremities. Grip strength equal bilaterally.   Lymphadenopathy:      Cervical: No cervical adenopathy.   Skin:     General: Skin is warm and dry.      Capillary Refill: Capillary refill takes less than 2 seconds.   Neurological:      Mental Status: She is alert and oriented to person, place, and time.      GCS: GCS eye subscore is 4. GCS verbal subscore is 5. GCS motor subscore is 6.      Sensory: Sensation is intact.      Motor: Motor function is intact. No weakness.      Coordination: Romberg sign positive. Finger-Nose-Finger Test abnormal.      Gait: Gait is intact.   Psychiatric:         Attention and Perception: Attention and perception normal.         Mood and Affect: Mood is anxious. Affect is tearful.         Speech: Speech normal.         Behavior: Behavior normal. Behavior is cooperative.         Thought Content: Thought content normal.         Cognition and Memory: Cognition and memory normal.         Judgment: Judgment normal.          Medical Decision Making   Medical Decision Making  Assessment:    42 year old female presents with concern for worsening dizziness over the past 3 days after traveling by car from [place], approximately 3-day drive.  Appears anxious, mild distress, vital signs stable, afebrile.  Physical exam concerning for signs of abnormal coordination and balance.  GCS 15.  Alert and oriented x 3.  Tearful but appropriate and  cooperative with normal cognition and memory and speech.  Symptoms worsened acutely following physical exam.    Differential diagnosis:    Vertigo, Mnire's disease, TIA from DVT, anemia, cardiac syndrome, PE    Plan and Results:    Discussed patient with Louretta Parma MD. Given patient's mild  distress, abnormal neurologic exam, and acutely worsening symptoms as well as recent long distance travel, recommended evaluation in the ED to rule out more serious causes of her dizziness.  Patient agreeable. Report called by RN.        Diagnosis and Disposition:   Patient transferred to [facility] ED by private vehicle, husband driving.      Final Diagnosis    ICD-10-CM ICD-9-CM   1. Dizziness of unknown etiology  R42 780.4     Orders Placed This Encounter   No orders placed during this encounter.        This note was dictated using Network engineer.  A reasonable attempt at proof reading has been made to minimize errors.      Gweneth Fritter, NP          Author:  Gweneth Fritter, NP

## 2022-07-11 NOTE — Patient Instructions (Signed)
Your condition is potentially life threatening. As such, we recommend that you should go to the emergency room now for further evaluation and treatment.    [facility]  [address]  [place], [place] [address]  [phone]

## 2022-07-14 LAB — EKG 12-LEAD
P: 16 deg
PR: 128 ms
QRS: 28 deg
QRSD: 83 ms
QT: 405 ms
QTc: 446 ms
Rate: 73 {beats}/min
T: 25 deg

## 2022-07-23 LAB — UNMAPPED LAB RESULTS
Basophil # (HT): 0.1 10 3/uL (ref 0.0–0.1)
Basophil % (HT): 1 % (ref 0–3)
Eosinophil # (HT): 0.2 10 3/uL (ref 0.0–0.8)
Eosinophil % (HT): 4 % (ref 0–5)
Hematocrit (HT): 41 % (ref 38–48)
Hemoglobin (HGB) (HT): 13.9 g/dL (ref 11.9–15.9)
Lymphocyte # (HT): 2.2 10 3/uL (ref 0.6–3.3)
Lymphocyte % (HT): 33 % (ref 15–45)
MCHC (HT): 33.9 g/dL (ref 31.1–34.5)
MCV (HT): 93 fL (ref 80–97)
Mean Corpuscular Hemoglobin (MCH) (HT): 31.4 pg — ABNORMAL HIGH (ref 26.6–30.6)
Mean Platelet Volume (HT): 9.3 fL (ref 9.0–12.2)
Monocyte # (HT): 0.5 10 3/uL (ref 0.1–1.1)
Monocyte % (HT): 7 % (ref 0–15)
Neutrophil # (HT): 3.6 10 3/uL (ref 2.0–7.1)
Platelets (HT): 370 10 3/uL (ref 150–450)
RBC (HT): 4.43 10 6/uL (ref 4.04–5.48)
RDW (HT): 12.3 % — ABNORMAL LOW (ref 12.9–16.3)
Seg Neut % (HT): 55 % (ref 45–75)
WBC (HT): 6.5 10 3/uL (ref 4.8–10.4)

## 2022-12-10 ENCOUNTER — Other Ambulatory Visit
Admission: RE | Admit: 2022-12-10 | Discharge: 2022-12-10 | Disposition: A | Discharge status: Home / Self Care | Payer: PRIVATE HEALTH INSURANCE | Source: Ambulatory Visit | Attending: Obstetrics and Gynecology | Admitting: Obstetrics and Gynecology

## 2022-12-10 DIAGNOSIS — N760 Acute vaginitis: Secondary | ICD-10-CM | POA: Insufficient documentation

## 2022-12-10 DIAGNOSIS — Z01419 Encounter for gynecological examination (general) (routine) without abnormal findings: Secondary | ICD-10-CM | POA: Insufficient documentation

## 2022-12-10 DIAGNOSIS — Z124 Encounter for screening for malignant neoplasm of cervix: Secondary | ICD-10-CM | POA: Insufficient documentation

## 2022-12-11 LAB — VAGINITIS SCREEN: DNA PROBE: Vaginitis Screen:DNA Probe: POSITIVE — AB

## 2022-12-17 LAB — GYN CYTOLOGY

## 2024-02-04 ENCOUNTER — Other Ambulatory Visit: Payer: Self-pay | Admitting: Obstetrics and Gynecology
